# Patient Record
Sex: Male | Born: 1995 | Race: White | Hispanic: No | Marital: Single | Smoking: Never smoker
Health system: Southern US, Community
[De-identification: ages and names within clinical notes are randomized; demographics above are authoritative.]

## PROBLEM LIST (undated history)

## (undated) DIAGNOSIS — K589 Irritable bowel syndrome without diarrhea: Secondary | ICD-10-CM

## (undated) DIAGNOSIS — F840 Autistic disorder: Secondary | ICD-10-CM

## (undated) HISTORY — PX: EYE SURGERY: SHX253

---
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
## 2015-12-12 HISTORY — PX: BILIARY DRAINAGE: SHX1229

## 2016-09-17 ENCOUNTER — Emergency Department: Payer: Medicaid Other

## 2016-09-17 ENCOUNTER — Encounter: Payer: Self-pay | Admitting: Emergency Medicine

## 2016-09-17 ENCOUNTER — Inpatient Hospital Stay
Admission: EM | Admit: 2016-09-17 | Discharge: 2016-09-26 | DRG: 871 | Disposition: A | Discharge status: Home / Self Care | Payer: Medicaid Other | Attending: Surgery | Admitting: Surgery

## 2016-09-17 DIAGNOSIS — R509 Fever, unspecified: Secondary | ICD-10-CM | POA: Diagnosis not present

## 2016-09-17 DIAGNOSIS — J9601 Acute respiratory failure with hypoxia: Secondary | ICD-10-CM | POA: Diagnosis not present

## 2016-09-17 DIAGNOSIS — A4159 Other Gram-negative sepsis: Secondary | ICD-10-CM | POA: Diagnosis present

## 2016-09-17 DIAGNOSIS — K819 Cholecystitis, unspecified: Secondary | ICD-10-CM

## 2016-09-17 DIAGNOSIS — J939 Pneumothorax, unspecified: Secondary | ICD-10-CM | POA: Diagnosis present

## 2016-09-17 DIAGNOSIS — R7881 Bacteremia: Secondary | ICD-10-CM | POA: Diagnosis not present

## 2016-09-17 DIAGNOSIS — R0602 Shortness of breath: Secondary | ICD-10-CM

## 2016-09-17 DIAGNOSIS — R079 Chest pain, unspecified: Secondary | ICD-10-CM | POA: Diagnosis not present

## 2016-09-17 DIAGNOSIS — J181 Lobar pneumonia, unspecified organism: Secondary | ICD-10-CM | POA: Diagnosis not present

## 2016-09-17 DIAGNOSIS — B961 Klebsiella pneumoniae [K. pneumoniae] as the cause of diseases classified elsewhere: Secondary | ICD-10-CM | POA: Diagnosis present

## 2016-09-17 DIAGNOSIS — F840 Autistic disorder: Secondary | ICD-10-CM | POA: Diagnosis present

## 2016-09-17 DIAGNOSIS — R5081 Fever presenting with conditions classified elsewhere: Secondary | ICD-10-CM | POA: Diagnosis not present

## 2016-09-17 DIAGNOSIS — K810 Acute cholecystitis: Secondary | ICD-10-CM | POA: Diagnosis present

## 2016-09-17 DIAGNOSIS — K8000 Calculus of gallbladder with acute cholecystitis without obstruction: Secondary | ICD-10-CM | POA: Diagnosis present

## 2016-09-17 DIAGNOSIS — R0600 Dyspnea, unspecified: Secondary | ICD-10-CM

## 2016-09-17 DIAGNOSIS — J156 Pneumonia due to other aerobic Gram-negative bacteria: Secondary | ICD-10-CM | POA: Diagnosis not present

## 2016-09-17 DIAGNOSIS — A419 Sepsis, unspecified organism: Secondary | ICD-10-CM | POA: Diagnosis not present

## 2016-09-17 DIAGNOSIS — R6520 Severe sepsis without septic shock: Secondary | ICD-10-CM | POA: Diagnosis present

## 2016-09-17 DIAGNOSIS — K589 Irritable bowel syndrome without diarrhea: Secondary | ICD-10-CM | POA: Diagnosis present

## 2016-09-17 DIAGNOSIS — J150 Pneumonia due to Klebsiella pneumoniae: Secondary | ICD-10-CM | POA: Diagnosis present

## 2016-09-17 DIAGNOSIS — E876 Hypokalemia: Secondary | ICD-10-CM | POA: Diagnosis not present

## 2016-09-17 DIAGNOSIS — J9600 Acute respiratory failure, unspecified whether with hypoxia or hypercapnia: Secondary | ICD-10-CM

## 2016-09-17 DIAGNOSIS — R109 Unspecified abdominal pain: Secondary | ICD-10-CM

## 2016-09-17 DIAGNOSIS — J189 Pneumonia, unspecified organism: Secondary | ICD-10-CM

## 2016-09-17 HISTORY — DX: Autistic disorder: F84.0

## 2016-09-17 HISTORY — DX: Irritable bowel syndrome, unspecified: K58.9

## 2016-09-17 LAB — URINALYSIS COMPLETE WITH MICROSCOPIC (ARMC ONLY)
BILIRUBIN URINE: NEGATIVE
Bacteria, UA: NONE SEEN
Glucose, UA: NEGATIVE mg/dL
Hgb urine dipstick: NEGATIVE
KETONES UR: NEGATIVE mg/dL
Leukocytes, UA: NEGATIVE
NITRITE: NEGATIVE
PH: 6 (ref 5.0–8.0)
Protein, ur: NEGATIVE mg/dL
RBC / HPF: NONE SEEN RBC/hpf (ref 0–5)
SPECIFIC GRAVITY, URINE: 1.011 (ref 1.005–1.030)

## 2016-09-17 LAB — PROTIME-INR
INR: 1.07
PROTHROMBIN TIME: 13.9 s (ref 11.4–15.2)

## 2016-09-17 LAB — CBC
HCT: 47.8 % (ref 40.0–52.0)
Hemoglobin: 16.9 g/dL (ref 13.0–18.0)
MCH: 30.3 pg (ref 26.0–34.0)
MCHC: 35.3 g/dL (ref 32.0–36.0)
MCV: 86 fL (ref 80.0–100.0)
PLATELETS: 259 10*3/uL (ref 150–440)
RBC: 5.56 MIL/uL (ref 4.40–5.90)
RDW: 12.8 % (ref 11.5–14.5)
WBC: 21.9 10*3/uL — ABNORMAL HIGH (ref 3.8–10.6)

## 2016-09-17 LAB — GLUCOSE, CAPILLARY: Glucose-Capillary: 147 mg/dL — ABNORMAL HIGH (ref 65–99)

## 2016-09-17 LAB — COMPREHENSIVE METABOLIC PANEL
ALK PHOS: 93 U/L (ref 38–126)
ALT: 34 U/L (ref 17–63)
ANION GAP: 9 (ref 5–15)
AST: 24 U/L (ref 15–41)
Albumin: 4.4 g/dL (ref 3.5–5.0)
BILIRUBIN TOTAL: 1.2 mg/dL (ref 0.3–1.2)
BUN: 13 mg/dL (ref 6–20)
CALCIUM: 9.3 mg/dL (ref 8.9–10.3)
CO2: 25 mmol/L (ref 22–32)
CREATININE: 1.02 mg/dL (ref 0.61–1.24)
Chloride: 98 mmol/L — ABNORMAL LOW (ref 101–111)
Glucose, Bld: 123 mg/dL — ABNORMAL HIGH (ref 65–99)
Potassium: 3.7 mmol/L (ref 3.5–5.1)
SODIUM: 132 mmol/L — AB (ref 135–145)
TOTAL PROTEIN: 7.6 g/dL (ref 6.5–8.1)

## 2016-09-17 LAB — LIPASE, BLOOD: Lipase: 16 U/L (ref 11–51)

## 2016-09-17 LAB — MONONUCLEOSIS SCREEN: Mono Screen: NEGATIVE

## 2016-09-17 LAB — LACTIC ACID, PLASMA: Lactic Acid, Venous: 1.4 mmol/L (ref 0.5–1.9)

## 2016-09-17 LAB — TROPONIN I: Troponin I: <0.03 ng/mL (ref <0.03)

## 2016-09-17 LAB — MRSA PCR SCREENING: MRSA BY PCR: NEGATIVE

## 2016-09-17 MED ORDER — MORPHINE SULFATE (PF) 4 MG/ML IV SOLN
4.0000 mg | Freq: Once | INTRAVENOUS | Status: DC
Start: 2016-09-17 — End: 2016-09-17

## 2016-09-17 MED ORDER — LACTATED RINGERS IV SOLN
INTRAVENOUS | Status: DC
  Administered 2016-09-17 – 2016-09-18 (×4): via INTRAVENOUS

## 2016-09-17 MED ORDER — ACETAMINOPHEN 500 MG PO TABS
1000.0000 mg | ORAL_TABLET | Freq: Four times a day (QID) | ORAL | Status: DC
  Administered 2016-09-17 – 2016-09-21 (×12): 1000 mg via ORAL
  Filled 2016-09-17 (×13): qty 2

## 2016-09-17 MED ORDER — ENOXAPARIN SODIUM 40 MG/0.4ML ~~LOC~~ SOLN
40.0000 mg | SUBCUTANEOUS | Status: DC
  Administered 2016-09-17: 40 mg via SUBCUTANEOUS
  Filled 2016-09-17: qty 0.4

## 2016-09-17 MED ORDER — ONDANSETRON 4 MG PO TBDP: 4.0000 mg | ORAL_TABLET | Freq: Four times a day (QID) | ORAL | Status: DC | PRN

## 2016-09-17 MED ORDER — SODIUM CHLORIDE 0.9 % IV BOLUS (SEPSIS)
1000.0000 mL | Freq: Once | INTRAVENOUS | Status: AC
  Administered 2016-09-17: 1000 mL via INTRAVENOUS

## 2016-09-17 MED ORDER — HYDROMORPHONE HCL 1 MG/ML IJ SOLN
0.5000 mg | INTRAMUSCULAR | Status: DC | PRN
  Administered 2016-09-18 – 2016-09-21 (×7): 0.5 mg via INTRAVENOUS
  Filled 2016-09-17 (×7): qty 1

## 2016-09-17 MED ORDER — PIPERACILLIN-TAZOBACTAM 3.375 G IVPB
3.3750 g | Freq: Four times a day (QID) | INTRAVENOUS | Status: DC
  Administered 2016-09-17 – 2016-09-18 (×4): 3.375 g via INTRAVENOUS
  Filled 2016-09-17 (×4): qty 50

## 2016-09-17 MED ORDER — IOPAMIDOL (ISOVUE-300) INJECTION 61%
100.0000 mL | Freq: Once | INTRAVENOUS | Status: AC | PRN
  Administered 2016-09-17: 100 mL via INTRAVENOUS

## 2016-09-17 MED ORDER — PIPERACILLIN-TAZOBACTAM 3.375 G IVPB
3.3750 g | Freq: Once | INTRAVENOUS | Status: AC
  Administered 2016-09-17: 3.375 g via INTRAVENOUS
  Filled 2016-09-17: qty 50

## 2016-09-17 MED ORDER — KETOROLAC TROMETHAMINE 30 MG/ML IJ SOLN
30.0000 mg | Freq: Four times a day (QID) | INTRAMUSCULAR | Status: DC
  Administered 2016-09-17 – 2016-09-18 (×4): 30 mg via INTRAVENOUS
  Filled 2016-09-17 (×4): qty 1

## 2016-09-17 MED ORDER — MORPHINE SULFATE (PF) 4 MG/ML IV SOLN
4.0000 mg | Freq: Once | INTRAVENOUS | Status: AC
Start: 2016-09-17 — End: 2016-09-17
  Administered 2016-09-17: 4 mg via INTRAVENOUS
  Filled 2016-09-17: qty 1

## 2016-09-17 MED ORDER — MORPHINE SULFATE (PF) 4 MG/ML IV SOLN
INTRAVENOUS | Status: AC
  Filled 2016-09-17: qty 1

## 2016-09-17 MED ORDER — IOPAMIDOL (ISOVUE-300) INJECTION 61%
30.0000 mL | Freq: Once | INTRAVENOUS | Status: AC | PRN
  Administered 2016-09-17: 30 mL via ORAL

## 2016-09-17 MED ORDER — MORPHINE SULFATE (PF) 4 MG/ML IV SOLN
4.0000 mg | Freq: Once | INTRAVENOUS | Status: AC
  Administered 2016-09-17: 4 mg via INTRAVENOUS

## 2016-09-17 MED ORDER — FAMOTIDINE IN NACL 20-0.9 MG/50ML-% IV SOLN
20.0000 mg | Freq: Two times a day (BID) | INTRAVENOUS | Status: DC
  Administered 2016-09-17 – 2016-09-18 (×3): 20 mg via INTRAVENOUS
  Filled 2016-09-17 (×4): qty 50

## 2016-09-17 MED ORDER — OXYCODONE HCL 5 MG PO TABS
5.0000 mg | ORAL_TABLET | ORAL | Status: DC | PRN
  Administered 2016-09-17: 5 mg via ORAL
  Administered 2016-09-17 – 2016-09-19 (×2): 10 mg via ORAL
  Administered 2016-09-19: 5 mg via ORAL
  Filled 2016-09-17: qty 2
  Filled 2016-09-17: qty 1
  Filled 2016-09-17: qty 2
  Filled 2016-09-17: qty 1

## 2016-09-17 MED ORDER — ONDANSETRON HCL 4 MG/2ML IJ SOLN
4.0000 mg | Freq: Once | INTRAMUSCULAR | Status: AC
  Administered 2016-09-17: 4 mg via INTRAVENOUS
  Filled 2016-09-17: qty 2

## 2016-09-17 MED ORDER — ONDANSETRON HCL 4 MG/2ML IJ SOLN: 4.0000 mg | Freq: Four times a day (QID) | INTRAMUSCULAR | Status: DC | PRN

## 2016-09-17 NOTE — ED Notes (Signed)
Patient transported to Ultrasound 

## 2016-09-17 NOTE — ED Notes (Signed)
Pt's room air sat noted to be in the upper 80's, pt falling asleep and resting after 2nd dose of pain medication, pt placed on 2L of O2 via [place], no distress noted, cont to monitor

## 2016-09-17 NOTE — ED Triage Notes (Signed)
Pt presents to ED with reports of RUQ abdominal pain. Pt denies NVD. Pt reports he had a small bowel movement yesterday but thinks he might be constipated. Pt reports decreased appetite.

## 2016-09-17 NOTE — ED Provider Notes (Signed)
[facility] Emergency Department Provider Note ____________________________________________   First MD Initiated Contact with Patient 09/17/16 [phone]     (approximate)  I have reviewed the triage vital signs and the nursing notes.   HISTORY  Chief Complaint Abdominal Pain    HPI Joshua [name] is a 20 y.o. male reports moderate and increasing right upper abdominal pain along with not wanting to eat anything, feeling dehydrated for the lastday, also nauseated but not vomiting. No diarrhea. Denies chest pain or shortness of breath. No recent trips or travel. He doesn't smoke or drink.  History of blood clots. No history of any heart problems.  No trouble urinating. No pain in the pelvis or testicles or groin. He does report yesterday his mid back was hurting along with the right-sided abdominal pain.   Past Medical History:  Diagnosis Date  . Autism   . Irritable bowel syndrome     There are no active problems to display for this patient.   Past Surgical History:  Procedure Laterality Date  . EYE SURGERY      Prior to Admission medications   Not on File    Allergies Review of patient's allergies indicates no known allergies.  No family history on file.  Social History Social History  Substance Use Topics  . Smoking status: Not on file  . Smokeless tobacco: Not on file  . Alcohol use Not on file    Review of Systems Constitutional:Fatigue, feels dehydrated Eyes: No visual changes. ENT: No sore throat. Cardiovascular: Denies chest pain. Respiratory: Denies shortness of breath. Gastrointestinal: No vomiting.  No diarrhea.  No constipation. Genitourinary: Negative for dysuria. Musculoskeletal: See history of present illness. Skin: Negative for rash. Neurological: Negative for headaches, focal weakness or numbness.  10-point ROS otherwise negative.  ____________________________________________   PHYSICAL EXAM:  VITAL SIGNS: ED  Triage Vitals  Enc Vitals Group     BP 09/17/16 0916 (!) 149/79     Pulse Rate 09/17/16 0916 (!) 137     Resp 09/17/16 0916 20     Temp 09/17/16 0916 98.3 F (36.8 C)     Temp Source 09/17/16 0916 Oral     SpO2 09/17/16 0916 97 %     Weight 09/17/16 0914 209 lb 7 oz (95 kg)     Height 09/17/16 0914 5\' 4"  (1.626 m)     Head Circumference --      Peak Flow --      Pain Score 09/17/16 0914 7     Pain Loc --      Pain Edu? --      Excl. in GC? --     Constitutional: Alert and oriented.Fatigued, otherwise appearing and in no acute distress. Does appear ill, but not in extremis. Both patient and his mother extremely pleasant. Eyes: Conjunctivae are normal. PERRL. EOMI. Head: Atraumatic. Nose: No congestion/rhinnorhea. Mouth/Throat: Mucous membranes are quite dry.  Oropharynx non-erythematous. Neck: No stridor.   Cardiovascular: Tachycardic rate, regular rhythm. Grossly normal heart sounds.  Good peripheral circulation. Respiratory: Normal respiratory effort.  No retractions. Lungs CTAB. Gastrointestinal: Soft and nontender over the left abdomen, moderate tenderness in the mid to right upper quadrant, with a equivocal but seemingly negative Murphy. Clear lungs. No CVA tenderness tenderness. No abdominal bruits. No CVA tenderness. Musculoskeletal: No lower extremity tenderness nor edema.   Neurologic:  Normal speech and language. No gross focal neurologic deficits are appreciated. No gait instability. Skin:  Skin is warm, dry and intact. No  rash noted. Psychiatric: Mood and affect are normal. Speech and behavior are normal.  ____________________________________________   LABS (all labs ordered are listed, but only abnormal results are displayed)  Labs Reviewed  COMPREHENSIVE METABOLIC PANEL - Abnormal; Notable for the following:       Result Value   Sodium 132 (*)    Chloride 98 (*)    Glucose, Bld 123 (*)    All other components within normal limits  CBC - Abnormal; Notable for  the following:    WBC 21.9 (*)    All other components within normal limits  URINALYSIS COMPLETEWITH MICROSCOPIC ([facility] ONLY) - Abnormal; Notable for the following:    Color, Urine YELLOW (*)    APPearance CLEAR (*)    Squamous Epithelial / LPF 0-5 (*)    All other components within normal limits  GLUCOSE, CAPILLARY - Abnormal; Notable for the following:    Glucose-Capillary 147 (*)    All other components within normal limits  CULTURE, BLOOD (ROUTINE X 2)  CULTURE, BLOOD (ROUTINE X 2)  LIPASE, BLOOD  LACTIC ACID, PLASMA  TROPONIN I  PROTIME-INR  MONONUCLEOSIS SCREEN  CBG MONITORING, ED   ____________________________________________  EKG  Reviewed injury by me at 9:20 AM Heart rate 125 PR 120 QTc 410 Sinus tachycardia, RSR prime pattern is noted though no evidence of block.  No evidence of acute ST elevation MI, nonspecific T wave abnormality seen is likely secondary to rate related. ____________________________________________  RADIOLOGY  Dg Chest 2 View  Result Date: 09/17/2016 CLINICAL DATA:  Pt has a fever and is having RUQ pain for a couple of days now. Nonsmoker. No heart or lung problems. EXAM: CHEST  2 VIEW COMPARISON:  None. FINDINGS: The heart size and mediastinal contours are within normal limits. Both lungs are clear. No pleural effusion or pneumothorax. The visualized skeletal structures are unremarkable. IMPRESSION: No active cardiopulmonary disease. Electronically Signed   By: Amie Portland M.D.   On: 09/17/2016 11:53   Ct Abdomen Pelvis W Contrast  Result Date: 09/17/2016 CLINICAL DATA:  Pt presents to ED with reports of RUQ abdominal pain. Pt denies NVD. Pt reports he had a small bowel movement yesterday but thinks he might be constipated. Pt reports decreased appetite EXAM: CT ABDOMEN AND PELVIS WITH CONTRAST TECHNIQUE: Multidetector CT imaging of the abdomen and pelvis was performed using the standard protocol following bolus administration of intravenous  contrast. CONTRAST:  ISOVUE-300 IOPAMIDOL (ISOVUE-300) INJECTION 61% COMPARISON:  Right upper quadrant ultrasound, 09/17/2016 FINDINGS: Lower chest: No acute abnormality. Hepatobiliary: Despite the unremarkable appearance of the gallbladder on the current ultrasound, on CT, the gallbladder is distended. Wall is prominent but not overtly thickened. There is pericholecystic inflammation. A stone projects in the gallbladder neck. The liver is unremarkable. There is no bile duct dilation. Pancreas: Unremarkable. No pancreatic ductal dilatation or surrounding inflammatory changes. Spleen: Normal in size without focal abnormality. Adrenals/Urinary Tract: Adrenal glands are unremarkable. Kidneys are normal, without renal calculi, focal lesion, or hydronephrosis. Bladder is unremarkable. Stomach/Bowel: Stomach is within normal limits. Appendix appears normal. No evidence of bowel wall thickening, distention, or inflammatory changes. Vascular/Lymphatic: No significant vascular findings are present. No enlarged abdominal or pelvic lymph nodes. Reproductive: Prostate is unremarkable. Other: No abdominal wall hernia or abnormality. No abdominopelvic ascites. Musculoskeletal: Unremarkable. IMPRESSION: 1. Gallbladder is distended with pericholecystic inflammation as well as a gallstone that projects in the gallbladder neck. Despite the normal appearance of the gallbladder on the current ultrasound, the findings strongly suggest  acute cholecystitis. No bile duct stone or biliary dilation. 2. No other acute abnormality within the abdomen pelvis. Exam is otherwise unremarkable. Electronically Signed   By: Amie Portlandavid  Ormond M.D.   On: 09/17/2016 11:59   [place] Abdomen Limited Ruq  Result Date: 09/17/2016 CLINICAL DATA:  One day history of right upper quadrant pain EXAM: US ABDOMEN LIMITED - RIGHT UPPER QUADRANT COMPARISON:  None. FINDINGS: Gallbladder: No gallstones or wall thickening visualized. There is no pericholecystic fluid.  No sonographic Murphy sign noted by sonographer. Common bile duct: Diameter: 4 mm. No intrahepatic or extrahepatic biliary duct dilatation. Liver: No focal lesion identified. Within normal limits in parenchymal echogenicity. IMPRESSION: Study within normal limits. Electronically Signed   By: Bretta BangWilliam  Woodruff III M.D.   On: 09/17/2016 10:36    ____________________________________________   PROCEDURES  Procedure(s) performed: None  Procedures  Critical Care performed: Yes, see critical care note(s)  CRITICAL CARE Performed by: Sharyn CreamerQUALE, Thora Scherman   Total critical care time: 40 minutes  Critical care time was exclusive of separately billable procedures and treating other patients.  Critical care was necessary to treat or prevent imminent or life-threatening deterioration.  Critical care was time spent personally by me on the following activities: development of treatment plan with patient and/or surrogate as well as nursing, discussions with consultants, evaluation of patient's response to treatment, examination of patient, obtaining history from patient or surrogate, ordering and performing treatments and interventions, ordering and review of laboratory studies, ordering and review of radiographic studies, pulse oximetry and re-evaluation of patient's condition.  Patient required evaluation for possible sepsis, required multiple fluid boluses due to tachycardia. Heart rate greater than 120, elevated white blood cell count, initiation of antibiotics. Code sepsis. ____________________________________________   INITIAL IMPRESSION / ASSESSMENT AND PLAN / ED COURSE  Pertinent labs & imaging results that were available during my care of the patient were reviewed by me and considered in my medical decision making (see chart for details).  Patient presents for evaluation for chills, right upper quadrant abdominal pain, dehydration and poor appetite. Notably quite tachycardic on presentation, he  does have a temperature 99.9 on my check, significant leukocytosis and focal pain in the right mid to right upper abdomen. We will initiate and started with right upper quadrant ultrasound which is not showing acute pathology, as we'll order a chest x-ray to evaluate for possible pulmonary etiology though I most suspect based on symptomatology abdominal etiology and will thus proceed with CT scan.  Clinical Course  Comment By Time  Patient reports no discomfort at this time. Patient did not wish for morphine. Discussed with patient lab results, and plan for CT and x-ray. Patient and mother both agreeable. Appears improved heart rate improved. Resting comfortably. Sharyn CreamerMark Zain Bingman, MD 10/08 1119   ----------------------------------------- 12:14 PM on 09/17/2016 -----------------------------------------  CT scan consistent with acute cholecystitis, interestingly is despite a normal ultrasound. Symptomatology is consistent with however, and I discussed the case with Wyonia HoughKatherine Laughlin of Gen. surgery who is aware of my concern for sepsis with possible cholecystitis. Patient reporting improvement, resting comfortably. Antibiotics given, code sepsis.  ____________________________________________   FINAL CLINICAL IMPRESSION(S) / ED DIAGNOSES  Final diagnoses:  Abdominal pain  Sepsis, due to unspecified organism [facility](HCC)  Acute cholecystitis      NEW MEDICATIONS STARTED DURING THIS VISIT:  New Prescriptions   No medications on file     Note:  This document was prepared using Dragon voice recognition software and may include unintentional dictation errors.  Sharyn Creamer, MD 09/17/16 1215

## 2016-09-17 NOTE — H&P (Signed)
Joshua [name] is an 20 y.o. male.   Chief Complaint: abdominal pain HPI: 20 year old male with complaint of abdominal pain starting yesterday. Patient is unsure what he ate right beforehand. Patient states that the pain started in mid day and was in his epigastric area and felt like a board through straight to his back. Patient states that the pain became increasingly worse to 10 out of 10 pain. Patient states that he did have some fever and chills as well. Patient had some nausea but no vomiting. Patient also had some pain in his right scapular region that felt like a soreness. Patient did not have any symptoms of flatulence diarrhea dysuria hematuria or jaundice.  Past Medical History:  Diagnosis Date  . Autism   . Irritable bowel syndrome     Past Surgical History:  Procedure Laterality Date  . EYE SURGERY      History reviewed. No pertinent family history. negative for heart disease, cancers or gallbladder disease Social History:  reports that he has never smoked. He has never used smokeless tobacco. His alcohol and drug histories are not on file.  Allergies: No Known Allergies  No prescriptions prior to admission.    Results for orders placed or performed during the hospital encounter of 09/17/16 (from the past 48 hour(s))  Lipase, blood     Status: None   Collection Time: 09/17/16  9:18 AM  Result Value Ref Range   Lipase 16 11 - 51 U/L  Comprehensive metabolic panel     Status: Abnormal   Collection Time: 09/17/16  9:18 AM  Result Value Ref Range   Sodium 132 (L) 135 - 145 mmol/L   Potassium 3.7 3.5 - 5.1 mmol/L   Chloride 98 (L) 101 - 111 mmol/L   CO2 25 22 - 32 mmol/L   Glucose, Bld 123 (H) 65 - 99 mg/dL   BUN 13 6 - 20 mg/dL   Creatinine, Ser 1.02 0.61 - 1.24 mg/dL   Calcium 9.3 8.9 - 10.3 mg/dL   Total Protein 7.6 6.5 - 8.1 g/dL   Albumin 4.4 3.5 - 5.0 g/dL   AST 24 15 - 41 U/L   ALT 34 17 - 63 U/L   Alkaline Phosphatase 93 38 - 126 U/L   Total Bilirubin 1.2 0.3  - 1.2 mg/dL   GFR calc non Af Amer >60 >60 mL/min   GFR calc Af Amer >60 >60 mL/min    Comment: (NOTE) The eGFR has been calculated using the CKD EPI equation. This calculation has not been validated in all clinical situations. eGFR's persistently <60 mL/min signify possible Chronic Kidney Disease.    Anion gap 9 5 - 15  CBC     Status: Abnormal   Collection Time: 09/17/16  9:18 AM  Result Value Ref Range   WBC 21.9 (H) 3.8 - 10.6 K/uL   RBC 5.56 4.40 - 5.90 MIL/uL   Hemoglobin 16.9 13.0 - 18.0 g/dL   HCT 47.8 40.0 - 52.0 %   MCV 86.0 80.0 - 100.0 fL   MCH 30.3 26.0 - 34.0 pg   MCHC 35.3 32.0 - 36.0 g/dL   RDW 12.8 11.5 - 14.5 %   Platelets 259 150 - 440 K/uL  Troponin I     Status: None   Collection Time: 09/17/16  9:18 AM  Result Value Ref Range   Troponin I <0.03 <0.03 ng/mL  Protime-INR     Status: None   Collection Time: 09/17/16  9:18 AM  Result  Value Ref Range   Prothrombin Time 13.9 11.4 - 15.2 seconds   INR 1.07   Mononucleosis screen     Status: None   Collection Time: 09/17/16  9:18 AM  Result Value Ref Range   Mono Screen NEGATIVE NEGATIVE  Lactic acid, plasma     Status: None   Collection Time: 09/17/16  9:43 AM  Result Value Ref Range   Lactic Acid, Venous 1.4 0.5 - 1.9 mmol/L  Glucose, capillary     Status: Abnormal   Collection Time: 09/17/16  9:45 AM  Result Value Ref Range   Glucose-Capillary 147 (H) 65 - 99 mg/dL  Urinalysis complete, with microscopic     Status: Abnormal   Collection Time: 09/17/16 10:40 AM  Result Value Ref Range   Color, Urine YELLOW (A) YELLOW   APPearance CLEAR (A) CLEAR   Glucose, UA NEGATIVE NEGATIVE mg/dL   Bilirubin Urine NEGATIVE NEGATIVE   Ketones, ur NEGATIVE NEGATIVE mg/dL   Specific Gravity, Urine 1.011 1.005 - 1.030   Hgb urine dipstick NEGATIVE NEGATIVE   pH 6.0 5.0 - 8.0   Protein, ur NEGATIVE NEGATIVE mg/dL   Nitrite NEGATIVE NEGATIVE   Leukocytes, UA NEGATIVE NEGATIVE   RBC / HPF NONE SEEN 0 - 5 RBC/hpf    WBC, UA 0-5 0 - 5 WBC/hpf   Bacteria, UA NONE SEEN NONE SEEN   Squamous Epithelial / LPF 0-5 (A) NONE SEEN   Mucous PRESENT    Dg Chest 2 View  Result Date: 09/17/2016 CLINICAL DATA:  Pt has a fever and is having RUQ pain for a couple of days now. Nonsmoker. No heart or lung problems. EXAM: CHEST  2 VIEW COMPARISON:  None. FINDINGS: The heart size and mediastinal contours are within normal limits. Both lungs are clear. No pleural effusion or pneumothorax. The visualized skeletal structures are unremarkable. IMPRESSION: No active cardiopulmonary disease. Electronically Signed   By: Lajean Manes M.D.   On: 09/17/2016 11:53   Ct Abdomen Pelvis W Contrast  Result Date: 09/17/2016 CLINICAL DATA:  Pt presents to ED with reports of RUQ abdominal pain. Pt denies NVD. Pt reports he had a small bowel movement yesterday but thinks he might be constipated. Pt reports decreased appetite EXAM: CT ABDOMEN AND PELVIS WITH CONTRAST TECHNIQUE: Multidetector CT imaging of the abdomen and pelvis was performed using the standard protocol following bolus administration of intravenous contrast. CONTRAST:  167m ISOVUE-300 IOPAMIDOL (ISOVUE-300) INJECTION 61% COMPARISON:  Right upper quadrant ultrasound, 09/17/2016 FINDINGS: Lower chest: No acute abnormality. Hepatobiliary: Despite the unremarkable appearance of the gallbladder on the current ultrasound, on CT, the gallbladder is distended. Wall is prominent but not overtly thickened. There is pericholecystic inflammation. A stone projects in the gallbladder neck. The liver is unremarkable. There is no bile duct dilation. Pancreas: Unremarkable. No pancreatic ductal dilatation or surrounding inflammatory changes. Spleen: Normal in size without focal abnormality. Adrenals/Urinary Tract: Adrenal glands are unremarkable. Kidneys are normal, without renal calculi, focal lesion, or hydronephrosis. Bladder is unremarkable. Stomach/Bowel: Stomach is within normal limits. Appendix  appears normal. No evidence of bowel wall thickening, distention, or inflammatory changes. Vascular/Lymphatic: No significant vascular findings are present. No enlarged abdominal or pelvic lymph nodes. Reproductive: Prostate is unremarkable. Other: No abdominal wall hernia or abnormality. No abdominopelvic ascites. Musculoskeletal: Unremarkable. IMPRESSION: 1. Gallbladder is distended with pericholecystic inflammation as well as a gallstone that projects in the gallbladder neck. Despite the normal appearance of the gallbladder on the current ultrasound, the findings strongly suggest acute cholecystitis.  No bile duct stone or biliary dilation. 2. No other acute abnormality within the abdomen pelvis. Exam is otherwise unremarkable. Electronically Signed   By: Lajean Manes M.D.   On: 09/17/2016 11:59   [place] Abdomen Limited Ruq  Result Date: 09/17/2016 CLINICAL DATA:  One day history of right upper quadrant pain EXAM: US ABDOMEN LIMITED - RIGHT UPPER QUADRANT COMPARISON:  None. FINDINGS: Gallbladder: No gallstones or wall thickening visualized. There is no pericholecystic fluid. No sonographic Murphy sign noted by sonographer. Common bile duct: Diameter: 4 mm. No intrahepatic or extrahepatic biliary duct dilatation. Liver: No focal lesion identified. Within normal limits in parenchymal echogenicity. IMPRESSION: Study within normal limits. Electronically Signed   By: Lowella Grip III M.D.   On: 09/17/2016 10:36    Review of Systems  Constitutional: Positive for chills and fever. Negative for diaphoresis and malaise/fatigue.  HENT: Negative for congestion.   Respiratory: Negative for cough and shortness of breath.   Cardiovascular: Negative for chest pain and leg swelling.  Gastrointestinal: Positive for abdominal pain, constipation, heartburn and nausea. Negative for blood in stool, diarrhea and vomiting.  Genitourinary: Negative for dysuria, flank pain and hematuria.  Musculoskeletal: Negative for  joint pain and myalgias.  Skin: Negative for itching and rash.  Neurological: Negative for dizziness, loss of consciousness, weakness and headaches.  Psychiatric/Behavioral: The patient is not nervous/anxious.   All other systems reviewed and are negative.   Blood pressure 139/75, pulse (!) 108, temperature 98.4 F (36.9 C), temperature source Oral, resp. rate 20, height '5\' 4"'  (1.626 m), weight 207 lb (93.9 kg), SpO2 96 %. Physical Exam  Vitals reviewed. Constitutional: He is oriented to person, place, and time. He appears well-developed and well-nourished. No distress.  HENT:  Head: Normocephalic and atraumatic.  Right Ear: External ear normal.  Left Ear: External ear normal.  Nose: Nose normal.  Mouth/Throat: Oropharynx is clear and moist. No oropharyngeal exudate.  Eyes: Conjunctivae and EOM are normal. Pupils are equal, round, and reactive to light. No scleral icterus.  Neck: Normal range of motion. Neck supple. No tracheal deviation present.  Cardiovascular: Regular rhythm, normal heart sounds and intact distal pulses.  Exam reveals no gallop and no friction rub.   No murmur heard. tachycardia  Respiratory: Effort normal. No respiratory distress. He has no wheezes. He has no rales.  GI: Soft. Bowel sounds are normal. He exhibits no distension. There is tenderness. There is no rebound and no guarding.  Epigastric and RUQ pain, + Murphy's sign  Musculoskeletal: Normal range of motion. He exhibits no edema, tenderness or deformity.  Neurological: He is alert and oriented to person, place, and time. No cranial nerve deficit.  Skin: Skin is warm and dry. No rash noted. No erythema. No pallor.  Psychiatric: He has a normal mood and affect. His behavior is normal. Judgment and thought content normal.     Assessment/Plan I have personally reviewed his past medical history which is negative except for irritable bowel syndrome which is not been formally diagnosed and autism.  I personally  reviewed his laboratory values which do have a significantly high white blood cell count of 21, his LFTs are within normal limits however his bilirubin is a 1.2 which is at the upper limit of normal. I have personally reviewed his ultrasound images which show a distended gallbladder but don't show any stones and no wall thickening on that test. I have also personally reviewed his CT scan images which do so some fluid and inflammation around  the gallbladder. Review the radiology reads as above  I do believe that she has acute cholecystitis given his results from the CT scanning positive Murphy sign. He appears to be dehydrated at this time and will continue to give him fluids and IV antibiotics. I did discuss with him and his mother that he will need his gallbladder out and likely will be able to get him on the schedule for tomorrow. I discussed with them that it would likely be my partner Dr. Hampton Abbot at that time and they expressed understanding and agreement with this plan.  The risks, benefits, complications, treatment options, and expected outcomes were discussed with the patient. The possibilities of bleeding, recurrent infection, finding a normal gallbladder, perforation of viscus organs, damage to surrounding structures, bile leak, abscess formation, needing a drain placed, the need for additional procedures, reaction to medication, pulmonary aspiration,  failure to diagnose a condition, the possible need to convert to an open procedure, and creating a complication requiring transfusion or operation were discussed with the patient. The patient and/or family concurred with the proposed plan, giving informed consent.      Hubbard Robinson, MD 09/17/2016, 6:02 PM

## 2016-09-18 ENCOUNTER — Encounter: Payer: Self-pay | Admitting: *Deleted

## 2016-09-18 ENCOUNTER — Encounter
Admission: EM | Disposition: A | Discharge status: Home / Self Care | Payer: Self-pay | Source: Home / Self Care | Attending: Surgery

## 2016-09-18 ENCOUNTER — Inpatient Hospital Stay: Payer: Medicaid Other

## 2016-09-18 ENCOUNTER — Inpatient Hospital Stay (HOSPITAL_COMMUNITY)
Admit: 2016-09-18 | Discharge: 2016-09-18 | Disposition: A | Discharge status: Home / Self Care | Payer: Medicaid Other | Attending: Internal Medicine | Admitting: Internal Medicine

## 2016-09-18 ENCOUNTER — Encounter: Payer: Self-pay | Admitting: Anesthesiology

## 2016-09-18 DIAGNOSIS — K8000 Calculus of gallbladder with acute cholecystitis without obstruction: Secondary | ICD-10-CM

## 2016-09-18 DIAGNOSIS — A419 Sepsis, unspecified organism: Secondary | ICD-10-CM

## 2016-09-18 DIAGNOSIS — J939 Pneumothorax, unspecified: Secondary | ICD-10-CM | POA: Diagnosis present

## 2016-09-18 DIAGNOSIS — R079 Chest pain, unspecified: Secondary | ICD-10-CM

## 2016-09-18 DIAGNOSIS — J9601 Acute respiratory failure with hypoxia: Secondary | ICD-10-CM

## 2016-09-18 LAB — ECHOCARDIOGRAM COMPLETE
AOPV: 0.84 m/s
AV peak Index: 1.12
AV pk vel: 148 cm/s
AVAREAVTI: 2.15 cm2
AVPG: 9 mmHg
CHL CUP MV DEC (S): 261
CHL CUP RV SYS PRESS: 31 mmHg
EERAT: 8.52
EWDT: 261 ms
FS: 30 % (ref 28–44)
Height: 60 in
IVS/LV PW RATIO, ED: 1
LA diam end sys: 34 mm
LA vol A4C: 29.2 ml
LADIAMINDEX: 1.77 cm/m2
LASIZE: 34 mm
LVEEAVG: 8.52
LVEEMED: 8.52
LVOT area: 2.54 cm2
LVOT diameter: 18 mm
LVOT peak grad rest: 6 mmHg
LVOTPV: 125 cm/s
MV Peak grad: 4 mmHg
MVPKEVEL: 95.4 m/s
PW: 10.5 mm — AB (ref 0.6–1.1)
RV LATERAL S' VELOCITY: 12.7 cm/s
RV TAPSE: 17.5 mm
Reg peak vel: 308 cm/s
TDI e' medial: 11.2
TR max vel: 308 cm/s
Weight: 3421.54 oz

## 2016-09-18 LAB — COMPREHENSIVE METABOLIC PANEL
ALK PHOS: 70 U/L (ref 38–126)
ALT: 23 U/L (ref 17–63)
AST: 16 U/L (ref 15–41)
Albumin: 3.3 g/dL — ABNORMAL LOW (ref 3.5–5.0)
Anion gap: 6 (ref 5–15)
BUN: 12 mg/dL (ref 6–20)
CALCIUM: 8.5 mg/dL — AB (ref 8.9–10.3)
CO2: 28 mmol/L (ref 22–32)
CREATININE: 1.1 mg/dL (ref 0.61–1.24)
Chloride: 101 mmol/L (ref 101–111)
Glucose, Bld: 94 mg/dL (ref 65–99)
Potassium: 3.9 mmol/L (ref 3.5–5.1)
Sodium: 135 mmol/L (ref 135–145)
Total Bilirubin: 1.5 mg/dL — ABNORMAL HIGH (ref 0.3–1.2)
Total Protein: 6 g/dL — ABNORMAL LOW (ref 6.5–8.1)

## 2016-09-18 LAB — CBC
HEMATOCRIT: 42.1 % (ref 40.0–52.0)
HEMOGLOBIN: 14.9 g/dL (ref 13.0–18.0)
MCH: 30.8 pg (ref 26.0–34.0)
MCHC: 35.4 g/dL (ref 32.0–36.0)
MCV: 87.1 fL (ref 80.0–100.0)
Platelets: 193 10*3/uL (ref 150–440)
RBC: 4.84 MIL/uL (ref 4.40–5.90)
RDW: 12.9 % (ref 11.5–14.5)
WBC: 21.5 10*3/uL — ABNORMAL HIGH (ref 3.8–10.6)

## 2016-09-18 LAB — BLOOD CULTURE ID PANEL (REFLEXED)
ACINETOBACTER BAUMANNII: NOT DETECTED
CANDIDA PARAPSILOSIS: NOT DETECTED
CARBAPENEM RESISTANCE: NOT DETECTED
Candida albicans: NOT DETECTED
Candida glabrata: NOT DETECTED
Candida krusei: NOT DETECTED
Candida tropicalis: NOT DETECTED
ENTEROBACTERIACEAE SPECIES: DETECTED — AB
ENTEROCOCCUS SPECIES: NOT DETECTED
Enterobacter cloacae complex: NOT DETECTED
Escherichia coli: NOT DETECTED
HAEMOPHILUS INFLUENZAE: NOT DETECTED
KLEBSIELLA PNEUMONIAE: DETECTED — AB
Klebsiella oxytoca: NOT DETECTED
Listeria monocytogenes: NOT DETECTED
Neisseria meningitidis: NOT DETECTED
PSEUDOMONAS AERUGINOSA: NOT DETECTED
Proteus species: NOT DETECTED
STAPHYLOCOCCUS AUREUS BCID: NOT DETECTED
STAPHYLOCOCCUS SPECIES: NOT DETECTED
STREPTOCOCCUS PNEUMONIAE: NOT DETECTED
STREPTOCOCCUS PYOGENES: NOT DETECTED
STREPTOCOCCUS SPECIES: NOT DETECTED
Serratia marcescens: NOT DETECTED
Streptococcus agalactiae: NOT DETECTED

## 2016-09-18 LAB — PROCALCITONIN: PROCALCITONIN: 1.72 ng/mL

## 2016-09-18 LAB — BRAIN NATRIURETIC PEPTIDE: B NATRIURETIC PEPTIDE 5: 75 pg/mL (ref 0.0–100.0)

## 2016-09-18 LAB — GLUCOSE, CAPILLARY: GLUCOSE-CAPILLARY: 83 mg/dL (ref 65–99)

## 2016-09-18 SURGERY — LAPAROSCOPIC CHOLECYSTECTOMY
Anesthesia: Choice

## 2016-09-18 MED ORDER — MORPHINE SULFATE (PF) 2 MG/ML IV SOLN: 2.0000 mg | INTRAVENOUS | Status: DC | PRN

## 2016-09-18 MED ORDER — PIPERACILLIN-TAZOBACTAM 3.375 G IVPB: 3.3750 g | Freq: Three times a day (TID) | INTRAVENOUS | Status: DC

## 2016-09-18 MED ORDER — FAMOTIDINE 20 MG PO TABS
20.0000 mg | ORAL_TABLET | Freq: Two times a day (BID) | ORAL | Status: DC
  Administered 2016-09-18 – 2016-09-26 (×16): 20 mg via ORAL
  Filled 2016-09-18 (×16): qty 1

## 2016-09-18 MED ORDER — DILTIAZEM HCL 100 MG IV SOLR: 5.0000 mg/h | INTRAVENOUS | Status: DC

## 2016-09-18 MED ORDER — AZITHROMYCIN 250 MG PO TABS
250.0000 mg | ORAL_TABLET | Freq: Every day | ORAL | Status: AC
  Administered 2016-09-19 – 2016-09-22 (×4): 250 mg via ORAL
  Filled 2016-09-18 (×4): qty 1

## 2016-09-18 MED ORDER — IOPAMIDOL (ISOVUE-370) INJECTION 76%
75.0000 mL | Freq: Once | INTRAVENOUS | Status: AC | PRN
  Administered 2016-09-18: 75 mL via INTRAVENOUS

## 2016-09-18 MED ORDER — BUPIVACAINE-EPINEPHRINE (PF) 0.25% -1:200000 IJ SOLN
INTRAMUSCULAR | Status: AC
  Filled 2016-09-18: qty 30

## 2016-09-18 MED ORDER — HEPARIN SODIUM (PORCINE) 5000 UNIT/ML IJ SOLN
5000.0000 [IU] | Freq: Three times a day (TID) | INTRAMUSCULAR | Status: DC
  Administered 2016-09-18 – 2016-09-20 (×5): 5000 [IU] via SUBCUTANEOUS
  Filled 2016-09-18 (×5): qty 1

## 2016-09-18 MED ORDER — LORAZEPAM 2 MG/ML IJ SOLN
0.5000 mg | Freq: Three times a day (TID) | INTRAMUSCULAR | Status: DC | PRN
  Administered 2016-09-19 – 2016-09-23 (×7): 0.5 mg via INTRAVENOUS
  Filled 2016-09-18 (×8): qty 1

## 2016-09-18 MED ORDER — FUROSEMIDE 10 MG/ML IJ SOLN: 40.0000 mg | Freq: Once | INTRAMUSCULAR | Status: DC

## 2016-09-18 MED ORDER — AZITHROMYCIN 500 MG PO TABS
500.0000 mg | ORAL_TABLET | Freq: Every day | ORAL | Status: AC
  Administered 2016-09-18: 500 mg via ORAL
  Filled 2016-09-18 (×2): qty 1

## 2016-09-18 MED ORDER — SODIUM CHLORIDE 0.9 % IV SOLN
250.0000 mL | INTRAVENOUS | Status: DC | PRN
  Administered 2016-09-23: 500 mL via INTRAVENOUS

## 2016-09-18 MED ORDER — SODIUM CHLORIDE 0.9 % IV SOLN: INTRAVENOUS | Status: DC

## 2016-09-18 MED ORDER — SODIUM CHLORIDE 0.45 % IV SOLN
INTRAVENOUS | Status: DC
  Administered 2016-09-18 – 2016-09-19 (×2): via INTRAVENOUS

## 2016-09-18 MED ORDER — SODIUM CHLORIDE 0.9 % IV SOLN
1.0000 g | Freq: Three times a day (TID) | INTRAVENOUS | Status: DC
  Administered 2016-09-18 – 2016-09-21 (×10): 1 g via INTRAVENOUS
  Filled 2016-09-18 (×11): qty 1

## 2016-09-18 SURGICAL SUPPLY — 43 items
APPLIER CLIP 5 13 M/L LIGAMAX5 (MISCELLANEOUS) ×3
BLADE SURG 15 STRL LF DISP TIS (BLADE) ×1 IMPLANT
BLADE SURG 15 STRL SS (BLADE) ×2
BLADE SURG SZ11 CARB STEEL (BLADE) ×3 IMPLANT
CANISTER SUCT 1200ML W/VALVE (MISCELLANEOUS) ×3 IMPLANT
CATH CHOLANGI 4FR 420404F (CATHETERS) IMPLANT
CHLORAPREP W/TINT 10.5 ML (MISCELLANEOUS) ×3 IMPLANT
CLIP APPLIE 5 13 M/L LIGAMAX5 (MISCELLANEOUS) ×1 IMPLANT
CONRAY 60ML FOR OR (MISCELLANEOUS) ×3 IMPLANT
DEFOGGER SCOPE WARMER CLEARIFY (MISCELLANEOUS) ×3 IMPLANT
DRAPE C-ARM XRAY 36X54 (DRAPES) IMPLANT
ELECT CAUTERY BLADE 6.4 (BLADE) ×3 IMPLANT
ELECT REM PT RETURN 9FT ADLT (ELECTROSURGICAL) ×3
ELECTRODE REM PT RTRN 9FT ADLT (ELECTROSURGICAL) ×1 IMPLANT
ENDOPOUCH RETRIEVER 10 (MISCELLANEOUS) ×3 IMPLANT
GLOVE PROTEXIS LATEX SZ 7.5 (GLOVE) ×3 IMPLANT
GLOVE SURG SYN 7.0 (GLOVE) ×3 IMPLANT
GOWN STRL REUS W/ TWL LRG LVL3 (GOWN DISPOSABLE) ×2 IMPLANT
GOWN STRL REUS W/TWL LRG LVL3 (GOWN DISPOSABLE) ×4
IRRIGATION STRYKERFLOW (MISCELLANEOUS) ×1 IMPLANT
IRRIGATOR STRYKERFLOW (MISCELLANEOUS) ×3
IV CATH ANGIO 12GX3 LT BLUE (NEEDLE) ×3 IMPLANT
IV NS 1000ML (IV SOLUTION) ×2
IV NS 1000ML BAXH (IV SOLUTION) ×1 IMPLANT
JACKSON PRATT 10 (INSTRUMENTS) IMPLANT
L-HOOK LAP DISP 36CM (ELECTROSURGICAL) ×3
LABEL OR SOLS (LABEL) ×3 IMPLANT
LHOOK LAP DISP 36CM (ELECTROSURGICAL) ×1 IMPLANT
LIQUID BAND (GAUZE/BANDAGES/DRESSINGS) ×3 IMPLANT
NDL HPO THNWL 1X22GA REG BVL (NEEDLE) ×1 IMPLANT
NDL SAFETY 22GX1.5 (NEEDLE) ×3 IMPLANT
NEEDLE SAFETY 22GX1 (NEEDLE) ×2
PACK LAP CHOLECYSTECTOMY (MISCELLANEOUS) ×3 IMPLANT
PENCIL ELECTRO HAND CTR (MISCELLANEOUS) ×3 IMPLANT
SCISSORS METZENBAUM CVD 33 (INSTRUMENTS) ×3 IMPLANT
SLEEVE ADV FIXATION 5X100MM (TROCAR) ×9 IMPLANT
SPONGE EXCIL AMD DRAIN 4X4 6P (MISCELLANEOUS) IMPLANT
SUT MNCRL 4-0 (SUTURE) ×2
SUT MNCRL 4-0 27XMFL (SUTURE) ×1
SUT VICRYL 0 AB UR-6 (SUTURE) ×6 IMPLANT
SUTURE MNCRL 4-0 27XMF (SUTURE) ×1 IMPLANT
TROCAR 130MM GELPORT  DAV (MISCELLANEOUS) ×3 IMPLANT
TUBING INSUFFLATOR HI FLOW (MISCELLANEOUS) ×3 IMPLANT

## 2016-09-18 NOTE — Consult Note (Signed)
[facility] [place] [facility] Consultation      Assessment and Plan:  A: -Acute hypoxemic respiratory failure. Suspect pneumonia, likely related to underlying acute cholecystitis. -Sepsis, secondary to above. -Right lower lobe atelectasis with elevated right sided diaphragm. -History of autism.  P: Broad-spectrum antibiotics. Check CT chest to rule out pulmonary embolism. Can proceed with cholecystectomy when patient is clinically stable, or the patient's cholecystitis progresses. Otherwise, may require cholecystostomy tube. If the patient's symptoms worsen.  Date: 09/18/2016  MRN# [id number] Joshua [name] Apr 21, 2005  Referring Physician: Dr. Elpidio Anis.   Joshua [name] is a 20 y.o. old male seen in consultation for chief complaint of:    Chief Complaint  Patient presents with  . Abdominal Pain    HPI:  Patient is a 20 year old male, he is accompanied by his mother. Patient presented to the hospital with increasing right upper quadrant abdominal pain. CT abdomen was performed on 09/17/16, suggestive of acute cholecystitis. Over the last 24 hours. Patient's oxygen saturations have dropped initially was on room air. Subsequently, this has been increased to 6 L via nasal cannula. The patient complains of mild dyspnea, however, he does not look to be in any respiratory distress.  On review of the patient's imaging. There does appear to be an elevated right-sided diaphragm, possible new right sided infiltrates, when combined with the presence of fever. This may be suggestive of pneumonia.   PMHX:   Past Medical History:  Diagnosis Date  . Autism   . Irritable bowel syndrome    Surgical Hx:  Past Surgical History:  Procedure Laterality Date  . EYE SURGERY     Family Hx:  History reviewed. No pertinent family history. Social Hx:   Social History  Substance Use Topics  . Smoking status: Never Smoker  . Smokeless tobacco: Never Used  . Alcohol use Not on file   Medication:        Allergies:  Review of patient's allergies indicates no known allergies.  Review of Systems: Gen:  Denies  fever, sweats, chills HEENT: Denies blurred vision, double vision.  Cvc:  No dizziness, chest pain. Resp:   Denies cough or sputum production Gi: Denies swallowing difficulty,  Gu:  Denies bladder incontinence, burning urine Ext:   No Joint pain, stiffness. Skin: No skin rash,  hives  Endoc:  No polyuria, polydipsia. Psych: No depression, insomnia. Other:  All other systems were reviewed with the patient and were negative other that what is mentioned in the HPI.   Physical Examination:   VS: BP (!) 142/89 (BP Location: Right Arm)   Pulse (!) 113   Temp (!) 100.4 F (38 C) (Oral)   Resp (!) 31   Ht 5' (1.524 m)   Wt 213 lb 13.5 oz (97 kg)   SpO2 95%   BMI 41.76 kg/m   General Appearance: No distress  Neuro:without focal findings,  speech normal,  HEENT: PERRLA, EOM intact.   Pulmonary: normal breath sounds, No wheezing. Decreased air entry in both lung bases. CardiovascularNormal S1,S2.  No m/r/g.   Abdomen: Benign, Soft, non-tender. Renal:  No costovertebral tenderness  GU:  No performed at this time. Endoc: No evident thyromegaly, no signs of acromegaly. Skin:   warm, no rashes, no ecchymosis  Extremities: normal, no cyanosis, clubbing.  Other findings:    LABORATORY PANEL:   CBC  Recent Labs Lab 09/18/16 0601  WBC 21.5*  HGB 14.9  HCT 42.1  PLT 193   ------------------------------------------------------------------------------------------------------------------  Chemistries   Recent  Labs Lab 09/18/16 0601  NA 135  K 3.9  CL 101  CO2 28  GLUCOSE 94  BUN 12  CREATININE 1.10  CALCIUM 8.5*  AST 16  ALT 23  ALKPHOS 70  BILITOT 1.5*   ------------------------------------------------------------------------------------------------------------------  Cardiac Enzymes  Recent Labs Lab 09/17/16 0918  TROPONINI <0.03    ------------------------------------------------------------  RADIOLOGY:  Dg Chest 1 View  Result Date: 09/18/2016 CLINICAL DATA:  Shortness of breath and right upper abdominal pain. EXAM: CHEST 1 VIEW COMPARISON:  09/17/2016 FINDINGS: Lung volumes are lower compared to the prior study with atelectasis present bilaterally, especially in the right perihilar lung. No overt edema or pleural effusion. The heart size and mediastinal contours are normal. IMPRESSION: Lower lung volumes with more prominent atelectasis bilaterally, most prominently in the right perihilar region. Electronically Signed   By: Irish Lack M.D.   On: 09/18/2016 12:13   Dg Chest 2 View  Result Date: 09/17/2016 CLINICAL DATA:  Pt has a fever and is having RUQ pain for a couple of days now. Nonsmoker. No heart or lung problems. EXAM: CHEST  2 VIEW COMPARISON:  None. FINDINGS: The heart size and mediastinal contours are within normal limits. Both lungs are clear. No pleural effusion or pneumothorax. The visualized skeletal structures are unremarkable. IMPRESSION: No active cardiopulmonary disease. Electronically Signed   By: Amie Portland M.D.   On: 09/17/2016 11:53   Ct Angio Chest Pe W Or Wo Contrast  Result Date: 09/18/2016 CLINICAL DATA:  Initial evaluation for acute dyspnea. EXAM: CT ANGIOGRAPHY CHEST WITH CONTRAST TECHNIQUE: Multidetector CT imaging of the chest was performed using the standard protocol during bolus administration of intravenous contrast. Multiplanar CT image reconstructions and MIPs were obtained to evaluate the vascular anatomy. CONTRAST:  75 cc of Isovue 370. COMPARISON:  Prior radiograph from earlier same day. FINDINGS: Cardiovascular: Intrathoracic aorta of normal caliber and appearance without acute abnormality. Visualized great vessels within normal limits. Heart size normal. No pericardial effusion. Pulmonary arteries adequately opacified for evaluation. Main pulmonary artery measures within normal  limits for size at 2.2 cm in diameter. No filling defect to suggest acute pulmonary embolism. Re-formatted imaging confirms these findings. Mediastinum/Nodes: The visualized thyroid is normal. No pathologically enlarged mediastinal or hilar lymph nodes identified. No axillary adenopathy. Esophagus within normal limits. Lungs/Pleura: Patchy multi focal consolidation present within the posterior aspect of both upper and lower lobes, concerning for multifocal pneumonia. Changes most severe on the right. Findings may be infectious nature, although possible aspiration could be considered. Associated small right pleural effusion. No pulmonary edema. No pneumothorax. No worrisome pulmonary nodule or mass. Tracheobronchial tree remains patent. Upper Abdomen: Visualized portions of the upper abdomen are unremarkable. Musculoskeletal: No acute osseous abnormality. No worrisome lytic or blastic osseous lesions. Review of the MIP images confirms the above findings. IMPRESSION: 1. No CT evidence for acute pulmonary embolism. 2. Multi focal areas of consolidation involving both the right upper and lower lobes, concerning for multifocal pneumonia. Changes are slightly worse on the right. Findings may be infectious in nature. Possible aspiration pneumonitis could also be considered. 3. Small right pleural effusion. Electronically Signed   By: Rise Mu M.D.   On: 09/18/2016 14:27   Ct Abdomen Pelvis W Contrast  Result Date: 09/17/2016 CLINICAL DATA:  Pt presents to ED with reports of RUQ abdominal pain. Pt denies NVD. Pt reports he had a small bowel movement yesterday but thinks he might be constipated. Pt reports decreased appetite EXAM: CT ABDOMEN AND PELVIS WITH  CONTRAST TECHNIQUE: Multidetector CT imaging of the abdomen and pelvis was performed using the standard protocol following bolus administration of intravenous contrast. CONTRAST:  100mL ISOVUE-300 IOPAMIDOL (ISOVUE-300) INJECTION 61% COMPARISON:  Right  upper quadrant ultrasound, 09/17/2016 FINDINGS: Lower chest: No acute abnormality. Hepatobiliary: Despite the unremarkable appearance of the gallbladder on the current ultrasound, on CT, the gallbladder is distended. Wall is prominent but not overtly thickened. There is pericholecystic inflammation. A stone projects in the gallbladder neck. The liver is unremarkable. There is no bile duct dilation. Pancreas: Unremarkable. No pancreatic ductal dilatation or surrounding inflammatory changes. Spleen: Normal in size without focal abnormality. Adrenals/Urinary Tract: Adrenal glands are unremarkable. Kidneys are normal, without renal calculi, focal lesion, or hydronephrosis. Bladder is unremarkable. Stomach/Bowel: Stomach is within normal limits. Appendix appears normal. No evidence of bowel wall thickening, distention, or inflammatory changes. Vascular/Lymphatic: No significant vascular findings are present. No enlarged abdominal or pelvic lymph nodes. Reproductive: Prostate is unremarkable. Other: No abdominal wall hernia or abnormality. No abdominopelvic ascites. Musculoskeletal: Unremarkable. IMPRESSION: 1. Gallbladder is distended with pericholecystic inflammation as well as a gallstone that projects in the gallbladder neck. Despite the normal appearance of the gallbladder on the current ultrasound, the findings strongly suggest acute cholecystitis. No bile duct stone or biliary dilation. 2. No other acute abnormality within the abdomen pelvis. Exam is otherwise unremarkable. Electronically Signed   By: Amie Portlandavid  Ormond M.D.   On: 09/17/2016 11:59   [place] Abdomen Limited Ruq  Result Date: 09/17/2016 CLINICAL DATA:  One day history of right upper quadrant pain EXAM: US ABDOMEN LIMITED - RIGHT UPPER QUADRANT COMPARISON:  None. FINDINGS: Gallbladder: No gallstones or wall thickening visualized. There is no pericholecystic fluid. No sonographic Murphy sign noted by sonographer. Common bile duct: Diameter: 4 mm. No  intrahepatic or extrahepatic biliary duct dilatation. Liver: No focal lesion identified. Within normal limits in parenchymal echogenicity. IMPRESSION: Study within normal limits. Electronically Signed   By: Bretta BangWilliam  Woodruff III M.D.   On: 09/17/2016 10:36       Thank  you for the consultation and for allowing [facility] to assist in the care of your patient. Our recommendations are noted above.  Please contact us if we can be of further service.   Wells Guileseep Madeleine Fenn, MD.  Board Certified in Internal Medicine, Pulmonary Medicine, Critical Care Medicine, and Sleep Medicine.  [facility] Office Number: [phone]  Santiago Gladavid Kasa, M.D.  Stephanie AcreVishal Mungal, M.D.  Billy Fischeravid Simonds, M.D  09/18/2016

## 2016-09-18 NOTE — Consult Note (Signed)
[facility] [place] [facility]   PATIENT NAME: Joshua SenterCody Schleicher    MR#:  [id number]  DATE OF BIRTH:  [birth year]  DATE OF ADMISSION:  09/17/2016  PRIMARY CARE PHYSICIAN: No PCP Per Patient   REQUESTING/REFERRING PHYSICIAN:  Henrene DodgeJose Piscoya MD  CHIEF COMPLAINT:   Chief Complaint  Patient presents with  . Abdominal Pain    HISTORY OF PRESENT ILLNESS: Joshua SenterCody Torti  is a 20 y.o. male with a known history of  Autism and Irritable bowel syndrome who was admitted with acute cholecystitis. Patient initial chest x-ray was negative he was not requiring any oxygen. However he began not to feel too well earlier today. The nurse checked on him he appeared to be gray. His oxygen saturation was noted to be in the 70s currently he is on 6 L oxygen. Patient himself states that he is not feeling well but denies any shortness of breath or chest pain.        PAST MEDICAL HISTORY:   Past Medical History:  Diagnosis Date  . Autism   . Irritable bowel syndrome     PAST SURGICAL HISTORY: Past Surgical History:  Procedure Laterality Date  . EYE SURGERY      SOCIAL HISTORY:  Social History  Substance Use Topics  . Smoking status: Never Smoker  . Smokeless tobacco: Never Used  . Alcohol use Not on file    FAMILY HISTORY: History reviewed. No pertinent family history.  DRUG ALLERGIES: No Known Allergies  REVIEW OF SYSTEMS:   CONSTITUTIONAL: Positive fever, fatigue or weakness.  EYES: No blurred or double vision.  EARS, NOSE, AND THROAT: No tinnitus or ear pain.  RESPIRATORY: No cough, shortness of breath, wheezing or hemoptysis.  CARDIOVASCULAR: No chest pain, orthopnea, edema.  GASTROINTESTINAL: No nausea, vomiting, diarrhea positive abdominal pain.  GENITOURINARY: No dysuria, hematuria.  ENDOCRINE: No polyuria, nocturia,  HEMATOLOGY: No anemia, easy bruising or bleeding SKIN: No rash or lesion. MUSCULOSKELETAL: No joint pain or arthritis.   NEUROLOGIC: No  tingling, numbness, weakness.  PSYCHIATRY: No anxiety or depression.   MEDICATIONS AT HOME:  Prior to Admission medications   Not on File      PHYSICAL EXAMINATION:   VITAL SIGNS: Blood pressure (!) 168/68, pulse (!) 118, temperature 98.4 F (36.9 C), resp. rate (!) 36, height 5\' 4"  (1.626 m), weight 207 lb (93.9 kg), SpO2 (!) 88 %.  GENERAL:  20 y.o.-year-old patient lying in the bed with no acute distress.  EYES: Pupils equal, round, reactive to light and accommodation. No scleral icterus. Extraocular muscles intact.  HEENT: Head atraumatic, normocephalic. Oropharynx and nasopharynx clear.  NECK:  Supple, no jugular venous distention. No thyroid enlargement, no tenderness.  LUNGS: Occasional crackles in the posterior aspect of both lungs CARDIOVASCULAR: S1, S2 normal. No murmurs, rubs, or gallops.  ABDOMEN: Soft, right upper quadrant tenderness no guarding nondistended. Bowel sounds present. No organomegaly or mass.  EXTREMITIES: No pedal edema, cyanosis, or clubbing.  NEUROLOGIC: Cranial nerves II through XII are intact. Muscle strength 5/5 in all extremities. Sensation intact. Gait not checked.  PSYCHIATRIC: The patient is alert and oriented x 3.  SKIN: No obvious rash, lesion, or ulcer.   LABORATORY PANEL:   CBC  Recent Labs Lab 09/17/16 0918 09/18/16 0601  WBC 21.9* 21.5*  HGB 16.9 14.9  HCT 47.8 42.1  PLT 259 193  MCV 86.0 87.1  MCH 30.3 30.8  MCHC 35.3 35.4  RDW 12.8 12.9   ------------------------------------------------------------------------------------------------------------------  Chemistries  Recent Labs Lab 09/17/16 0918 09/18/16 0601  NA 132* 135  K 3.7 3.9  CL 98* 101  CO2 25 28  GLUCOSE 123* 94  BUN 13 12  CREATININE 1.02 1.10  CALCIUM 9.3 8.5*  AST 24 16  ALT 34 23  ALKPHOS 93 70  BILITOT 1.2 1.5*   ------------------------------------------------------------------------------------------------------------------ estimated  creatinine clearance is 109.8 mL/min (by C-G formula based on SCr of 1.1 mg/dL). ------------------------------------------------------------------------------------------------------------------ No results for input(s): TSH, T4TOTAL, T3FREE, THYROIDAB in the last 72 hours.  Invalid input(s): FREET3   Coagulation profile  Recent Labs Lab 09/17/16 0918  INR 1.07   ------------------------------------------------------------------------------------------------------------------- No results for input(s): DDIMER in the last 72 hours. -------------------------------------------------------------------------------------------------------------------  Cardiac Enzymes  Recent Labs Lab 09/17/16 0918  TROPONINI <0.03   ------------------------------------------------------------------------------------------------------------------ Invalid input(s): POCBNP  ---------------------------------------------------------------------------------------------------------------  Urinalysis    Component Value Date/Time   COLORURINE YELLOW (A) 09/17/2016 1040   APPEARANCEUR CLEAR (A) 09/17/2016 1040   LABSPEC 1.011 09/17/2016 1040   PHURINE 6.0 09/17/2016 1040   GLUCOSEU NEGATIVE 09/17/2016 1040   HGBUR NEGATIVE 09/17/2016 1040   BILIRUBINUR NEGATIVE 09/17/2016 1040   KETONESUR NEGATIVE 09/17/2016 1040   PROTEINUR NEGATIVE 09/17/2016 1040   NITRITE NEGATIVE 09/17/2016 1040   LEUKOCYTESUR NEGATIVE 09/17/2016 1040     RADIOLOGY: Dg Chest 2 View  Result Date: 09/17/2016 CLINICAL DATA:  Pt has a fever and is having RUQ pain for a couple of days now. Nonsmoker. No heart or lung problems. EXAM: CHEST  2 VIEW COMPARISON:  None. FINDINGS: The heart size and mediastinal contours are within normal limits. Both lungs are clear. No pleural effusion or pneumothorax. The visualized skeletal structures are unremarkable. IMPRESSION: No active cardiopulmonary disease. Electronically Signed   By: Amie Portland M.D.   On: 09/17/2016 11:53   Ct Abdomen Pelvis W Contrast  Result Date: 09/17/2016 CLINICAL DATA:  Pt presents to ED with reports of RUQ abdominal pain. Pt denies NVD. Pt reports he had a small bowel movement yesterday but thinks he might be constipated. Pt reports decreased appetite EXAM: CT ABDOMEN AND PELVIS WITH CONTRAST TECHNIQUE: Multidetector CT imaging of the abdomen and pelvis was performed using the standard protocol following bolus administration of intravenous contrast. CONTRAST:  ISOVUE-300 IOPAMIDOL (ISOVUE-300) INJECTION 61% COMPARISON:  Right upper quadrant ultrasound, 09/17/2016 FINDINGS: Lower chest: No acute abnormality. Hepatobiliary: Despite the unremarkable appearance of the gallbladder on the current ultrasound, on CT, the gallbladder is distended. Wall is prominent but not overtly thickened. There is pericholecystic inflammation. A stone projects in the gallbladder neck. The liver is unremarkable. There is no bile duct dilation. Pancreas: Unremarkable. No pancreatic ductal dilatation or surrounding inflammatory changes. Spleen: Normal in size without focal abnormality. Adrenals/Urinary Tract: Adrenal glands are unremarkable. Kidneys are normal, without renal calculi, focal lesion, or hydronephrosis. Bladder is unremarkable. Stomach/Bowel: Stomach is within normal limits. Appendix appears normal. No evidence of bowel wall thickening, distention, or inflammatory changes. Vascular/Lymphatic: No significant vascular findings are present. No enlarged abdominal or pelvic lymph nodes. Reproductive: Prostate is unremarkable. Other: No abdominal wall hernia or abnormality. No abdominopelvic ascites. Musculoskeletal: Unremarkable. IMPRESSION: 1. Gallbladder is distended with pericholecystic inflammation as well as a gallstone that projects in the gallbladder neck. Despite the normal appearance of the gallbladder on the current ultrasound, the findings strongly suggest acute  cholecystitis. No bile duct stone or biliary dilation. 2. No other acute abnormality within the abdomen pelvis. Exam is otherwise unremarkable. Electronically Signed   By: Renard Hamper.D.  On: 09/17/2016 11:59   US Abdomen Limited Ruq  Result Date: 09/17/2016 CLINICAL DATA:  One day history of right upper quadrant pain EXAM: US ABDOMEN LIMITED - RIGHT UPPER QUADRANT COMPARISON:  None. FINDINGS: Gallbladder: No gallstones or wall thickening visualized. There is no pericholecystic fluid. No sonographic Murphy sign noted by sonographer. Common bile duct: Diameter: 4 mm. No intrahepatic or extrahepatic biliary duct dilatation. Liver: No focal lesion identified. Within normal limits in parenchymal echogenicity. IMPRESSION: Study within normal limits. Electronically Signed   By: Bretta Bang III M.D.   On: 09/17/2016 10:36    EKG: Orders placed or performed during the hospital encounter of 09/17/16  . EKG 12-Lead  . EKG 12-Lead  . ED EKG 12-Lead  . ED EKG 12-Lead  . EKG 12-Lead  . EKG 12-Lead    IMPRESSION AND PLAN: Patient is a 20 year old admitted with acute cholecystitis now acute respiratory failure  1. Acute hypoxic respiratory failure Suspect either due to fluid overload or due to ARDS as a result of underlying infection At this point I will stop fluids, I will await read on the chest x-ray Add BNP to current blood work I have asked the pulmonary physician to come evaluate him due to concern for ARDS Continue oxygen therapy if patient condition worsens will need transfer to the stepdown unit  2. Acute cholecystitis In light of acute respiratory issues would hold off on surgical intervention for today Continue IV antibiotics Further therapy per surgery  3. Misc: lovenox   All the records are reviewed and case discussed with ED provider. Management plans discussed with the patient, family and they are in agreement.  CODE STATUS:    Code Status Orders        Start      Ordered   09/17/16 1458  Full code  Continuous     09/17/16 1457    Code Status History    Date Active Date Inactive Code Status Order ID Comments User Context   This patient has a current code status but no historical code status.       TOTAL TIME TAKING CARE OF THIS PATIENT:55 minutes.    Auburn Bilberry M.D on 09/18/2016 at 11:51 AM  Between 7am to 6pm - Pager - [phone]  After 6pm go to [email or web address] - password [email or web address] [facility]  [facility]  Office  [phone]  CC: Primary care physician; No PCP Per Patient

## 2016-09-18 NOTE — Progress Notes (Signed)
Pharmacy Antibiotic Note  Joshua [name] is a 20 y.o. male admitted on 09/17/2016 with cholecystitis and Klebsiella bacteremia. Patient admitted to ICU for ARDS. Initially on Zosyn but will escalate to meropenem to cover for possible ESBL Klebsiella pneumoniae due to patient's deteriorating clinical condition.   Plan: Meropenem 1 g iv q 8 hours.   Height: 5' (152.4 cm) Weight: 213 lb 13.5 oz (97 kg) IBW/kg (Calculated) : 50  Temp (24hrs), Avg:99.2 F (37.3 C), Min:98.4 F (36.9 C), Max:100.9 F (38.3 C)   Recent Labs Lab 09/17/16 0918 09/17/16 0943 09/18/16 0601  WBC 21.9*  --  21.5*  CREATININE 1.02  --  1.10  LATICACIDVEN  --  1.4  --     Estimated Creatinine Clearance: 103.4 mL/min (by C-G formula based on SCr of 1.1 mg/dL).    No Known Allergies  Antimicrobials this admission: Zosyn 10/8 >> 10/9 Meropenem 10/9 >>   Dose adjustments this admission:   Microbiology results: 10/8 BCx: GNR with BCID Klebsiella pneumoniae 10/8 MRSA PCR: negative  Thank you for allowing pharmacy to be a part of this patient's care.  Joshua [name], Joshua [name] 09/18/2016 2:33 PM

## 2016-09-18 NOTE — Progress Notes (Signed)
Telephone report called to Nia, Charity fundraiserN.  Pt transported via bed to ICU 3.

## 2016-09-18 NOTE — Progress Notes (Signed)
Patient called out asking for pain medication.  Upon entering room patient with chills and looked gray.  Vital signs taken and revealed hypoxia.  Oxygen incresed from 2L nasal cannula to 6L.  Dr. Aleen CampiPiscoya called and notified.  Verbal orders obtained for stat ABG, chest x-ray, and stat medical consult.

## 2016-09-18 NOTE — Progress Notes (Signed)
  Echocardiogram 2D Echocardiogram has been performed.  Joshua SavoyCasey [name] [name] 09/18/2016, 3:42 PM

## 2016-09-18 NOTE — Progress Notes (Signed)
09/18/2016  Subjective: No acute events overnight. Patient's pain is improving although has been feeling feverish subjectively. No nausea or vomiting.  Vital signs: Temp:  [98.3 F (36.8 C)-100.9 F (38.3 C)] 98.6 F (37 C) (10/09 0800) Pulse Rate:  [99-137] 107 (10/09 0533) Resp:  [18-41] 20 (10/09 0533) BP: (114-156)/(65-98) 121/65 (10/09 0533) SpO2:  [89 %-100 %] 92 % (10/09 0533) Weight:  [93.9 kg (207 lb)-95 kg (209 lb 7 oz)] 93.9 kg (207 lb) (10/08 1600)   Intake/Output: 10/08 0701 - 10/09 0700 In: 4070 [I.V.:1870; IV Piggyback:2200] Out: 250 [Urine:250]    Physical Exam: Constitutional: No acute distress Cardiac:  Low grade tachycardia Pulm: Lungs are clear bilaterally, no respiratory distress Abdomen: Soft nondistended, with tenderness to palpation the right upper quadrant.  Labs:   Recent Labs  09/17/16 0918 09/18/16 0601  WBC 21.9* 21.5*  HGB 16.9 14.9  HCT 47.8 42.1  PLT 259 193    Recent Labs  09/17/16 0918 09/18/16 0601  NA 132* 135  K 3.7 3.9  CL 98* 101  CO2 25 28  GLUCOSE 123* 94  BUN 13 12  CREATININE 1.02 1.10  CALCIUM 9.3 8.5*    Recent Labs  09/17/16 0918  LABPROT 13.9  INR 1.07    Imaging: Dg Chest 2 View  Result Date: 09/17/2016 CLINICAL DATA:  Pt has a fever and is having RUQ pain for a couple of days now. Nonsmoker. No heart or lung problems. EXAM: CHEST  2 VIEW COMPARISON:  None. FINDINGS: The heart size and mediastinal contours are within normal limits. Both lungs are clear. No pleural effusion or pneumothorax. The visualized skeletal structures are unremarkable. IMPRESSION: No active cardiopulmonary disease. Electronically Signed   By: Amie Portland M.D.   On: 09/17/2016 11:53   Ct Abdomen Pelvis W Contrast  Result Date: 09/17/2016 CLINICAL DATA:  Pt presents to ED with reports of RUQ abdominal pain. Pt denies NVD. Pt reports he had a small bowel movement yesterday but thinks he might be constipated. Pt reports decreased  appetite EXAM: CT ABDOMEN AND PELVIS WITH CONTRAST TECHNIQUE: Multidetector CT imaging of the abdomen and pelvis was performed using the standard protocol following bolus administration of intravenous contrast. CONTRAST:  ISOVUE-300 IOPAMIDOL (ISOVUE-300) INJECTION 61% COMPARISON:  Right upper quadrant ultrasound, 09/17/2016 FINDINGS: Lower chest: No acute abnormality. Hepatobiliary: Despite the unremarkable appearance of the gallbladder on the current ultrasound, on CT, the gallbladder is distended. Wall is prominent but not overtly thickened. There is pericholecystic inflammation. A stone projects in the gallbladder neck. The liver is unremarkable. There is no bile duct dilation. Pancreas: Unremarkable. No pancreatic ductal dilatation or surrounding inflammatory changes. Spleen: Normal in size without focal abnormality. Adrenals/Urinary Tract: Adrenal glands are unremarkable. Kidneys are normal, without renal calculi, focal lesion, or hydronephrosis. Bladder is unremarkable. Stomach/Bowel: Stomach is within normal limits. Appendix appears normal. No evidence of bowel wall thickening, distention, or inflammatory changes. Vascular/Lymphatic: No significant vascular findings are present. No enlarged abdominal or pelvic lymph nodes. Reproductive: Prostate is unremarkable. Other: No abdominal wall hernia or abnormality. No abdominopelvic ascites. Musculoskeletal: Unremarkable. IMPRESSION: 1. Gallbladder is distended with pericholecystic inflammation as well as a gallstone that projects in the gallbladder neck. Despite the normal appearance of the gallbladder on the current ultrasound, the findings strongly suggest acute cholecystitis. No bile duct stone or biliary dilation. 2. No other acute abnormality within the abdomen pelvis. Exam is otherwise unremarkable. Electronically Signed   By: Amie Portland M.D.   On: 09/17/2016  11:59   [place] Abdomen Limited Ruq  Result Date: 09/17/2016 CLINICAL DATA:  One day  history of right upper quadrant pain EXAM: US ABDOMEN LIMITED - RIGHT UPPER QUADRANT COMPARISON:  None. FINDINGS: Gallbladder: No gallstones or wall thickening visualized. There is no pericholecystic fluid. No sonographic Murphy sign noted by sonographer. Common bile duct: Diameter: 4 mm. No intrahepatic or extrahepatic biliary duct dilatation. Liver: No focal lesion identified. Within normal limits in parenchymal echogenicity. IMPRESSION: Study within normal limits. Electronically Signed   By: Bretta BangWilliam  Woodruff III M.D.   On: 09/17/2016 10:36    Assessment/Plan: 20 year old male with acute cholecystitis and Klebsiella bacteremia  -We'll take patient today to the operating room for lumbar scopic cholecystectomy. We have discussed with the patient the risks and benefits of the procedure and he is willing to proceed. -We'll continue IV Zosyn for his bacteremia.   Howie IllJose Luis Chriss Mannan, MD [facility]

## 2016-09-18 NOTE — Progress Notes (Signed)
PHARMACY - PHYSICIAN COMMUNICATION CRITICAL VALUE ALERT - BLOOD CULTURE IDENTIFICATION (BCID)  Results for orders placed or performed during the hospital encounter of 09/17/16  Blood Culture ID Panel (Reflexed) (Collected: 09/17/2016  9:44 AM)  Result Value Ref Range   Enterococcus species NOT DETECTED NOT DETECTED   Listeria monocytogenes NOT DETECTED NOT DETECTED   Staphylococcus species NOT DETECTED NOT DETECTED   Staphylococcus aureus NOT DETECTED NOT DETECTED   Streptococcus species NOT DETECTED NOT DETECTED   Streptococcus agalactiae NOT DETECTED NOT DETECTED   Streptococcus pneumoniae NOT DETECTED NOT DETECTED   Streptococcus pyogenes NOT DETECTED NOT DETECTED   Acinetobacter baumannii NOT DETECTED NOT DETECTED   Enterobacteriaceae species DETECTED (A) NOT DETECTED   Enterobacter cloacae complex NOT DETECTED NOT DETECTED   Escherichia coli NOT DETECTED NOT DETECTED   Klebsiella oxytoca NOT DETECTED NOT DETECTED   Klebsiella pneumoniae DETECTED (A) NOT DETECTED   Proteus species NOT DETECTED NOT DETECTED   Serratia marcescens NOT DETECTED NOT DETECTED   Carbapenem resistance NOT DETECTED NOT DETECTED   Haemophilus influenzae NOT DETECTED NOT DETECTED   Neisseria meningitidis NOT DETECTED NOT DETECTED   Pseudomonas aeruginosa NOT DETECTED NOT DETECTED   Candida albicans NOT DETECTED NOT DETECTED   Candida glabrata NOT DETECTED NOT DETECTED   Candida krusei NOT DETECTED NOT DETECTED   Candida parapsilosis NOT DETECTED NOT DETECTED   Candida tropicalis NOT DETECTED NOT DETECTED    Name of physician (or Provider) Contacted: Cooper  Changes to prescribed antibiotics required: n/a. Patient already on Zosyn for cholecystitis.  Riann Oman S 09/18/2016  3:21 AM

## 2016-09-18 NOTE — Progress Notes (Signed)
Dr. Allena KatzPatel notified and will see patient

## 2016-09-19 ENCOUNTER — Encounter: Payer: Self-pay | Admitting: Radiology

## 2016-09-19 ENCOUNTER — Inpatient Hospital Stay: Payer: Medicaid Other

## 2016-09-19 DIAGNOSIS — K810 Acute cholecystitis: Secondary | ICD-10-CM

## 2016-09-19 DIAGNOSIS — J150 Pneumonia due to Klebsiella pneumoniae: Secondary | ICD-10-CM

## 2016-09-19 LAB — COMPREHENSIVE METABOLIC PANEL
ALT: 20 U/L (ref 17–63)
AST: 17 U/L (ref 15–41)
Albumin: 2.9 g/dL — ABNORMAL LOW (ref 3.5–5.0)
Alkaline Phosphatase: 63 U/L (ref 38–126)
Anion gap: 7 (ref 5–15)
BILIRUBIN TOTAL: 1.2 mg/dL (ref 0.3–1.2)
BUN: 12 mg/dL (ref 6–20)
CHLORIDE: 100 mmol/L — AB (ref 101–111)
CO2: 26 mmol/L (ref 22–32)
CREATININE: 1 mg/dL (ref 0.61–1.24)
Calcium: 8.4 mg/dL — ABNORMAL LOW (ref 8.9–10.3)
GFR calc non Af Amer: >60 mL/min (ref >60)
Glucose, Bld: 94 mg/dL (ref 65–99)
POTASSIUM: 3.7 mmol/L (ref 3.5–5.1)
SODIUM: 133 mmol/L — AB (ref 135–145)
TOTAL PROTEIN: 6 g/dL — AB (ref 6.5–8.1)

## 2016-09-19 LAB — CBC
HCT: 40.8 % (ref 40.0–52.0)
HEMOGLOBIN: 14.4 g/dL (ref 13.0–18.0)
MCH: 30.7 pg (ref 26.0–34.0)
MCHC: 35.4 g/dL (ref 32.0–36.0)
MCV: 86.8 fL (ref 80.0–100.0)
PLATELETS: 185 10*3/uL (ref 150–440)
RBC: 4.7 MIL/uL (ref 4.40–5.90)
RDW: 12.6 % (ref 11.5–14.5)
WBC: 15.2 10*3/uL — ABNORMAL HIGH (ref 3.8–10.6)

## 2016-09-19 MED ORDER — MORPHINE SULFATE (PF) 4 MG/ML IV SOLN
3.0000 mg | Freq: Once | INTRAVENOUS | Status: AC
Start: 2016-09-19 — End: 2016-09-19
  Administered 2016-09-19: 3 mg via INTRAVENOUS
  Filled 2016-09-19: qty 1

## 2016-09-19 MED ORDER — OXYCODONE HCL 5 MG PO TABS
5.0000 mg | ORAL_TABLET | ORAL | Status: DC | PRN
  Administered 2016-09-20 – 2016-09-22 (×4): 10 mg via ORAL
  Administered 2016-09-23: 5 mg via ORAL
  Filled 2016-09-19: qty 1
  Filled 2016-09-19 (×4): qty 2

## 2016-09-19 MED ORDER — NALOXONE HCL 0.4 MG/ML IJ SOLN: 0.4000 mg | INTRAMUSCULAR | Status: DC | PRN

## 2016-09-19 MED ORDER — TECHNETIUM TC 99M MEBROFENIN IV KIT
5.0000 | PACK | Freq: Once | INTRAVENOUS | Status: AC | PRN
  Administered 2016-09-19: 4.938 via INTRAVENOUS

## 2016-09-19 MED ORDER — ACETAMINOPHEN 325 MG PO TABS: 650.0000 mg | ORAL_TABLET | ORAL | Status: DC | PRN

## 2016-09-19 MED ORDER — SODIUM CHLORIDE 0.9 % IV SOLN
INTRAVENOUS | Status: AC
  Administered 2016-09-19 (×2): via INTRAVENOUS

## 2016-09-19 MED ORDER — TECHNETIUM TC 99M MEBROFENIN IV KIT
2.0000 | PACK | Freq: Once | INTRAVENOUS | Status: AC | PRN
  Administered 2016-09-19: 2.152 via INTRAVENOUS

## 2016-09-19 MED ORDER — NALOXONE HCL 0.4 MG/ML IJ SOLN
INTRAMUSCULAR | Status: AC
  Filled 2016-09-19: qty 1

## 2016-09-19 NOTE — Care Management (Signed)
Patient admitted with acute cholecystitis and required  transferre to icu stepdown for hypoxia .  Suspect pneumonia with sepsis.  Currently on nasal cannula and IV antibiotics.  he does have history of autism.  It appears that he has presented from a home environment

## 2016-09-19 NOTE — Consult Note (Signed)
[facility] Consultation      Assessment and Plan:  A: -Acute hypoxemic respiratory failure. Suspect pneumonia, likely related to underlying acute cholecystitis. -Sepsis, secondary to above. -Right lower lobe atelectasis with elevated right sided diaphragm. -History of autism.  P: Broad-spectrum antibiotics. Meropenem 10/10> CTA chest with no PE on 10/9 Spoke with surgery (Dr. Evette Cristal), recommend HIDA scan, if positive then setup for perc chole tubes.    Date: 09/19/2016  MRN# [id number] Joshua [name] [birth year]  Referring Physician: Dr. Elpidio Anis.   Joshua [name] is a 20 y.o. old male seen in consultation for chief complaint of:    Chief Complaint  Patient presents with  . Abdominal Pain    HPI:  Patient is a 20 year old male, he is accompanied by his mother. Patient presented to the hospital with increasing right upper quadrant abdominal pain. CT abdomen was performed on 09/17/16, suggestive of acute cholecystitis. Over the last 24 hours. Patient's oxygen saturations have dropped initially was on room air. Subsequently, this has been increased to 6 L via nasal cannula. The patient complains of mild dyspnea, however, he does not look to be in any respiratory distress.  On review of the patient's imaging. There does appear to be an elevated right-sided diaphragm, possible new right sided infiltrates, when combined with the presence of fever. This may be suggestive of pneumonia.  SUBJ: Milld temp elevations overnight, awake and alert today, moderate abd pain.    PMHX:   Past Medical History:  Diagnosis Date  . Autism   . Irritable bowel syndrome    Surgical Hx:  Past Surgical History:  Procedure Laterality Date  . EYE SURGERY     Family Hx:  History reviewed. No pertinent family history. Social Hx:   Social History  Substance Use Topics  . Smoking status: Never Smoker  . Smokeless tobacco: Never Used  . Alcohol use Not on file   Medication:         Allergies:  Review of patient's allergies indicates no known allergies.  Review of Systems: Gen:  Denies  fever, sweats, chills HEENT: Denies blurred vision, double vision.  Cvc:  No dizziness, chest pain. Resp:   Denies cough or sputum production Gi: Denies swallowing difficulty,  Admits nausea, admits abdominal pain Gu:  Denies bladder incontinence, burning urine Ext:   No Joint pain, stiffness. Skin: No skin rash,  hives  Endoc:  No polyuria, polydipsia. Psych: No depression, insomnia. Other:  All other systems were reviewed with the patient and were negative other that what is mentioned in the HPI.   Physical Examination:   VS: BP (!) 167/80   Pulse (!) 119   Temp 99.7 F (37.6 C)   Resp (!) 36   Ht 5' (1.524 m)   Wt 212 lb 8.4 oz (96.4 kg)   SpO2 91%   BMI 41.51 kg/m   General Appearance: No distress  Neuro:without focal findings,  speech normal,  HEENT: PERRLA, EOM intact.   Pulmonary: normal breath sounds, No wheezing. Decreased air entry in both lung bases. CardiovascularNormal S1,S2.  No m/r/g.   Abdomen: Benign, Soft, moderate RUQ tenderness Renal:  No costovertebral tenderness  GU:  No performed at this time. Endoc: No evident thyromegaly, no signs of acromegaly. Skin:   warm, no rashes, no ecchymosis  Extremities: normal, no cyanosis, clubbing.  Other findings:    LABORATORY PANEL:   CBC  Recent Labs Lab 09/19/16 0433  WBC 15.2*  HGB 14.4  HCT 40.8  PLT 185   ------------------------------------------------------------------------------------------------------------------  Chemistries   Recent Labs Lab 09/19/16 0433  NA 133*  K 3.7  CL 100*  CO2 26  GLUCOSE 94  BUN 12  CREATININE 1.00  CALCIUM 8.4*  AST 17  ALT 20  ALKPHOS 63  BILITOT 1.2   ------------------------------------------------------------------------------------------------------------------  Cardiac Enzymes  Recent Labs Lab 09/17/16 0918  TROPONINI  <0.03   ------------------------------------------------------------  RADIOLOGY:  Dg Chest 1 View  Result Date: 09/18/2016 CLINICAL DATA:  Shortness of breath and right upper abdominal pain. EXAM: CHEST 1 VIEW COMPARISON:  09/17/2016 FINDINGS: Lung volumes are lower compared to the prior study with atelectasis present bilaterally, especially in the right perihilar lung. No overt edema or pleural effusion. The heart size and mediastinal contours are normal. IMPRESSION: Lower lung volumes with more prominent atelectasis bilaterally, most prominently in the right perihilar region. Electronically Signed   By: Irish Lack M.D.   On: 09/18/2016 12:13   Ct Angio Chest Pe W Or Wo Contrast  Result Date: 09/18/2016 CLINICAL DATA:  Initial evaluation for acute dyspnea. EXAM: CT ANGIOGRAPHY CHEST WITH CONTRAST TECHNIQUE: Multidetector CT imaging of the chest was performed using the standard protocol during bolus administration of intravenous contrast. Multiplanar CT image reconstructions and MIPs were obtained to evaluate the vascular anatomy. CONTRAST:  75 cc of Isovue 370. COMPARISON:  Prior radiograph from earlier same day. FINDINGS: Cardiovascular: Intrathoracic aorta of normal caliber and appearance without acute abnormality. Visualized great vessels within normal limits. Heart size normal. No pericardial effusion. Pulmonary arteries adequately opacified for evaluation. Main pulmonary artery measures within normal limits for size at 2.2 cm in diameter. No filling defect to suggest acute pulmonary embolism. Re-formatted imaging confirms these findings. Mediastinum/Nodes: The visualized thyroid is normal. No pathologically enlarged mediastinal or hilar lymph nodes identified. No axillary adenopathy. Esophagus within normal limits. Lungs/Pleura: Patchy multi focal consolidation present within the posterior aspect of both upper and lower lobes, concerning for multifocal pneumonia. Changes most severe on the  right. Findings may be infectious nature, although possible aspiration could be considered. Associated small right pleural effusion. No pulmonary edema. No pneumothorax. No worrisome pulmonary nodule or mass. Tracheobronchial tree remains patent. Upper Abdomen: Visualized portions of the upper abdomen are unremarkable. Musculoskeletal: No acute osseous abnormality. No worrisome lytic or blastic osseous lesions. Review of the MIP images confirms the above findings. IMPRESSION: 1. No CT evidence for acute pulmonary embolism. 2. Multi focal areas of consolidation involving both the right upper and lower lobes, concerning for multifocal pneumonia. Changes are slightly worse on the right. Findings may be infectious in nature. Possible aspiration pneumonitis could also be considered. 3. Small right pleural effusion. Electronically Signed   By: Rise Mu M.D.   On: 09/18/2016 14:27   Dg Chest Port 1 View  Result Date: 09/19/2016 CLINICAL DATA:  Pneumothorax EXAM: PORTABLE CHEST 1 VIEW COMPARISON:  Yesterday FINDINGS: Lungs remain under aerated. Bilateral airspace disease is worse. Normal heart size. Upper mediastinum is prominent likely due to low volumes and vascular crowding. There is no pneumothorax. IMPRESSION: Worsening bilateral airspace disease. Electronically Signed   By: Jolaine Click M.D.   On: 09/19/2016 07:50       Thank  you for the consultation and for allowing [facility] to assist in the care of your patient. Our recommendations are noted above.  Please contact us if we can be of further service.  CCM Time - 35 mins  Stephanie Acre, MD [facility] Pager [phone] (please enter 7-digits) On Call Pager - [phone] (please enter 7-digits)   09/19/2016

## 2016-09-19 NOTE — Progress Notes (Signed)
Pharmacy Antibiotic Note  Joshua [name] is a 20 y.o. male admitted on 09/17/2016 with cholecystitis and Klebsiella bacteremia. Patient admitted to ICU for ARDS. Initially on Zosyn but will escalate to meropenem to cover for possible ESBL Klebsiella pneumoniae due to patient's deteriorating clinical condition. Azithromycin added for atypical coverage for PNA.  Plan: Continue Meropenem 1 g iv q 8 hours.   Height: 5' (152.4 cm) Weight: 212 lb 8.4 oz (96.4 kg) IBW/kg (Calculated) : 50  Temp (24hrs), Avg:99.5 F (37.5 C), Min:98.7 F (37.1 C), Max:100.7 F (38.2 C)   Recent Labs Lab 09/17/16 0918 09/17/16 0943 09/18/16 0601 09/19/16 0433  WBC 21.9*  --  21.5* 15.2*  CREATININE 1.02  --  1.10 1.00  LATICACIDVEN  --  1.4  --   --     Estimated Creatinine Clearance: 113.4 mL/min (by C-G formula based on SCr of 1 mg/dL).    No Known Allergies  Antimicrobials this admission: Zosyn 10/8 >> 10/9 Meropenem 10/9 >>  Azithromycin 10/9 >>   Dose adjustments this admission:   Microbiology results: 10/8 BCx: GNR with BCID Klebsiella pneumoniae 10/8 MRSA PCR: negative  Thank you for allowing pharmacy to be a part of this patient's care.  Luisa HartChristy, Denyce Harr D 09/19/2016 3:45 PM

## 2016-09-19 NOTE — Progress Notes (Signed)
[facility] [place] [facility]   PATIENT NAME: Joshua SenterCody [name]    MR#:  [id number]  DATE OF BIRTH:  08/05/1996  SUBJECTIVE:  CHIEF COMPLAINT:   Chief Complaint  Patient presents with  . Abdominal Pain   Still has abd pain and SOB. Unable to take deep breaths. Febrile 6 L O2  REVIEW OF SYSTEMS:    Review of Systems  Constitutional: Positive for malaise/fatigue. Negative for chills and fever.  HENT: Negative for sore throat.   Eyes: Negative for blurred vision, double vision and pain.  Respiratory: Positive for cough and shortness of breath. Negative for hemoptysis and wheezing.   Cardiovascular: Negative for chest pain, palpitations, orthopnea and leg swelling.  Gastrointestinal: Positive for abdominal pain and nausea. Negative for constipation, diarrhea, heartburn and vomiting.  Genitourinary: Negative for dysuria and hematuria.  Musculoskeletal: Negative for back pain and joint pain.  Skin: Negative for rash.  Neurological: Positive for weakness. Negative for sensory change, speech change, focal weakness and headaches.  Endo/Heme/Allergies: Does not bruise/bleed easily.  Psychiatric/Behavioral: Negative for depression. The patient is not nervous/anxious.     DRUG ALLERGIES:  No Known Allergies  VITALS:  Blood pressure 137/85, pulse (!) 129, temperature 99.7 F (37.6 C), resp. rate (!) 27, height 5' (1.524 m), weight 96.4 kg (212 lb 8.4 oz), SpO2 92 %.  PHYSICAL EXAMINATION:   Physical Exam  GENERAL:  20 y.o.-year-old patient lying in the bed and looks critically ill EYES: Pupils equal, round, reactive to light and accommodation. No scleral icterus. Extraocular muscles intact.  HEENT: Head atraumatic, normocephalic. Oropharynx and nasopharynx clear.  NECK:  Supple, no jugular venous distention. No thyroid enlargement, no tenderness.  LUNGS: bilateral ronchi with decreased breath sounds. Shallow breathing CARDIOVASCULAR: S1, S2 normal. No  murmurs, rubs, or gallops.  ABDOMEN: Soft, nondistended. Bowel sounds present. No organomegaly or mass. Tender. EXTREMITIES: No cyanosis, clubbing or edema b/l.     PSYCHIATRIC: The patient is alert and oriented x 3.  SKIN: No obvious rash, lesion, or ulcer.   LABORATORY PANEL:   CBC  Recent Labs Lab 09/19/16 0433  WBC 15.2*  HGB 14.4  HCT 40.8  PLT 185   ------------------------------------------------------------------------------------------------------------------ Chemistries   Recent Labs Lab 09/19/16 0433  NA 133*  K 3.7  CL 100*  CO2 26  GLUCOSE 94  BUN 12  CREATININE 1.00  CALCIUM 8.4*  AST 17  ALT 20  ALKPHOS 63  BILITOT 1.2   ------------------------------------------------------------------------------------------------------------------  Cardiac Enzymes  Recent Labs Lab 09/17/16 0918  TROPONINI <0.03   ------------------------------------------------------------------------------------------------------------------  RADIOLOGY:  Dg Chest 1 View  Result Date: 09/18/2016 CLINICAL DATA:  Shortness of breath and right upper abdominal pain. EXAM: CHEST 1 VIEW COMPARISON:  09/17/2016 FINDINGS: Lung volumes are lower compared to the prior study with atelectasis present bilaterally, especially in the right perihilar lung. No overt edema or pleural effusion. The heart size and mediastinal contours are normal. IMPRESSION: Lower lung volumes with more prominent atelectasis bilaterally, most prominently in the right perihilar region. Electronically Signed   By: Irish LackGlenn  Yamagata M.D.   On: 09/18/2016 12:13   Ct Angio Chest Pe W Or Wo Contrast  Result Date: 09/18/2016 CLINICAL DATA:  Initial evaluation for acute dyspnea. EXAM: CT ANGIOGRAPHY CHEST WITH CONTRAST TECHNIQUE: Multidetector CT imaging of the chest was performed using the standard protocol during bolus administration of intravenous contrast. Multiplanar CT image reconstructions and MIPs were obtained to  evaluate the vascular anatomy. CONTRAST:  75 cc of  Isovue 370. COMPARISON:  Prior radiograph from earlier same day. FINDINGS: Cardiovascular: Intrathoracic aorta of normal caliber and appearance without acute abnormality. Visualized great vessels within normal limits. Heart size normal. No pericardial effusion. Pulmonary arteries adequately opacified for evaluation. Main pulmonary artery measures within normal limits for size at 2.2 cm in diameter. No filling defect to suggest acute pulmonary embolism. Re-formatted imaging confirms these findings. Mediastinum/Nodes: The visualized thyroid is normal. No pathologically enlarged mediastinal or hilar lymph nodes identified. No axillary adenopathy. Esophagus within normal limits. Lungs/Pleura: Patchy multi focal consolidation present within the posterior aspect of both upper and lower lobes, concerning for multifocal pneumonia. Changes most severe on the right. Findings may be infectious nature, although possible aspiration could be considered. Associated small right pleural effusion. No pulmonary edema. No pneumothorax. No worrisome pulmonary nodule or mass. Tracheobronchial tree remains patent. Upper Abdomen: Visualized portions of the upper abdomen are unremarkable. Musculoskeletal: No acute osseous abnormality. No worrisome lytic or blastic osseous lesions. Review of the MIP images confirms the above findings. IMPRESSION: 1. No CT evidence for acute pulmonary embolism. 2. Multi focal areas of consolidation involving both the right upper and lower lobes, concerning for multifocal pneumonia. Changes are slightly worse on the right. Findings may be infectious in nature. Possible aspiration pneumonitis could also be considered. 3. Small right pleural effusion. Electronically Signed   By: Rise Mu M.D.   On: 09/18/2016 14:27   Ct Abdomen Pelvis W Contrast  Result Date: 09/17/2016 CLINICAL DATA:  Pt presents to ED with reports of RUQ abdominal pain. Pt  denies NVD. Pt reports he had a small bowel movement yesterday but thinks he might be constipated. Pt reports decreased appetite EXAM: CT ABDOMEN AND PELVIS WITH CONTRAST TECHNIQUE: Multidetector CT imaging of the abdomen and pelvis was performed using the standard protocol following bolus administration of intravenous contrast. CONTRAST:  ISOVUE-300 IOPAMIDOL (ISOVUE-300) INJECTION 61% COMPARISON:  Right upper quadrant ultrasound, 09/17/2016 FINDINGS: Lower chest: No acute abnormality. Hepatobiliary: Despite the unremarkable appearance of the gallbladder on the current ultrasound, on CT, the gallbladder is distended. Wall is prominent but not overtly thickened. There is pericholecystic inflammation. A stone projects in the gallbladder neck. The liver is unremarkable. There is no bile duct dilation. Pancreas: Unremarkable. No pancreatic ductal dilatation or surrounding inflammatory changes. Spleen: Normal in size without focal abnormality. Adrenals/Urinary Tract: Adrenal glands are unremarkable. Kidneys are normal, without renal calculi, focal lesion, or hydronephrosis. Bladder is unremarkable. Stomach/Bowel: Stomach is within normal limits. Appendix appears normal. No evidence of bowel wall thickening, distention, or inflammatory changes. Vascular/Lymphatic: No significant vascular findings are present. No enlarged abdominal or pelvic lymph nodes. Reproductive: Prostate is unremarkable. Other: No abdominal wall hernia or abnormality. No abdominopelvic ascites. Musculoskeletal: Unremarkable. IMPRESSION: 1. Gallbladder is distended with pericholecystic inflammation as well as a gallstone that projects in the gallbladder neck. Despite the normal appearance of the gallbladder on the current ultrasound, the findings strongly suggest acute cholecystitis. No bile duct stone or biliary dilation. 2. No other acute abnormality within the abdomen pelvis. Exam is otherwise unremarkable. Electronically Signed   By: Amie Portland M.D.   On: 09/17/2016 11:59   Dg Chest Port 1 View  Result Date: 09/19/2016 CLINICAL DATA:  Pneumothorax EXAM: PORTABLE CHEST 1 VIEW COMPARISON:  Yesterday FINDINGS: Lungs remain under aerated. Bilateral airspace disease is worse. Normal heart size. Upper mediastinum is prominent likely due to low volumes and vascular crowding. There is no pneumothorax. IMPRESSION: Worsening bilateral airspace disease. Electronically Signed  By: Jolaine Click M.D.   On: 09/19/2016 07:50     ASSESSMENT AND PLAN:   Patient is a 20 year old admitted with acute cholecystitis now acute respiratory failure  * Multifocal pneumonia likely aspirational with Acute hypoxic respiratory failure and sepsis On IV antibiotics. Next when necessary. Wean oxygen as tolerated. Acutely ill.  * Acute cholecystitis On IV antibiotics. Further management as per surgery. Not a candidate for cholecystectomy at this time.  * DVT prophylaxis: lovenox  All the records are reviewed and case discussed with Care Management/Social Workerr. Management plans discussed with the patient, family and they are in agreement.  CODE STATUS: FULL CODE  DVT Prophylaxis: SCDs  TOTAL CC TIME TAKING CARE OF THIS PATIENT: 35 minutes.   Milagros Loll R M.D on 09/19/2016 at 11:50 AM  Between 7am to 6pm - Pager - [phone]  After 6pm go to [email or web address] - password [email or web address] [facility]  [facility]  Office  [phone]  CC: Primary care physician; No PCP Per Patient  Note: This dictation was prepared with Dragon dictation along with smaller phrase technology. Any transcriptional errors that result from this process are unintentional.

## 2016-09-19 NOTE — Progress Notes (Signed)
Chaplain met Pt's family in the family waiting room and talked to them for awhile. Pt's family asked Ch if he can visit with Pt and offer prayers for him when he returns from a procedure, Ch agreed to do so. In the meantime, Ch offered prayers for Pt who was still undergoing a procedure and for his family to cope as they waited for him.     09/19/16 2000  Clinical Encounter Type  Visited With Family  Visit Type Initial  Referral From Family  Spiritual Encounters  Spiritual Needs Prayer;Other (Comment)

## 2016-09-19 NOTE — Progress Notes (Signed)
Patient just sent to nuclear med for scan. Heart rate was in the 115 range and now 130's again. Dr Sung AmabileSimonds aware. Spoke with Coralee RudDudley in nuclear med and states that patients mom can come down with him for reassurance.

## 2016-09-19 NOTE — Progress Notes (Signed)
Patient ID: Joshua [name], male   DOB: November 05, 1996, 20 y.o.   MRN: [id number] Surgery follow up. Pt reportedly developed RLL pneumonia and was transferred to IC yesterday. He also has Klebsiella in blood.  He states his breathing is a little better. Still has ruq abd pain. T max-99.7, tachycardic, bp slightly elevated. Lungs- decreased sound right mid/lower lobes. Abdomen, tender in ruq. Active bowel sounds. WBC 15K.LFTs normal. CT showed some signs of inflammation around gallbladder, US -no stones.  Pt obviously is not a surgical candidate at present. Given pos blood culture, may want to consider a HIDA scan to confirm acute cholecystitis. If confirmed, may consider  Percutaneous cholecystostomy. Discussed with pt, his mother and  Dr. Dema SeverinMungal.

## 2016-09-19 NOTE — Progress Notes (Signed)
Pt in nuclear medicine for scan.  Dr. Ova FreshwaterLiebkemann with radiology ordered 3 mg morphine once for test.  Pt mother has concerns regarding pt receiving morphine, as pt mother states that when pt in ED and received morphine, "his HR increased and his breathing rate and oxygen dropped."  Rn called and spoke with Dr. Ova FreshwaterLiebkemann regarding mothers concern.  Per Ova FreshwaterLiebkemann, pt needs to receive morphine in order for scan to proceed, if mother still has concerns then will have to discontinue scan.  Rn went and spoke with pt mother regarding Dr. Javier GlazierLiebkemann's recommendation.  Per pt mother, she would like to proceed with scan if possible.  Spoke with Bennett ScrapeBincy Varughese, NP regarding situation.  Per Bincy, may give narcan if needed if pt has suppressed respirations.  Pt mother in agreement with giving morphine if narcan available as antidote for adverse reactions.

## 2016-09-20 ENCOUNTER — Inpatient Hospital Stay: Payer: Medicaid Other

## 2016-09-20 ENCOUNTER — Encounter: Payer: Self-pay | Admitting: Interventional Radiology

## 2016-09-20 DIAGNOSIS — J181 Lobar pneumonia, unspecified organism: Secondary | ICD-10-CM

## 2016-09-20 HISTORY — PX: IR GENERIC HISTORICAL: IMG1180011

## 2016-09-20 LAB — COMPREHENSIVE METABOLIC PANEL
ALBUMIN: 3 g/dL — AB (ref 3.5–5.0)
ALK PHOS: 62 U/L (ref 38–126)
ALT: 31 U/L (ref 17–63)
ANION GAP: 9 (ref 5–15)
AST: 35 U/L (ref 15–41)
BUN: 11 mg/dL (ref 6–20)
CALCIUM: 8.1 mg/dL — AB (ref 8.9–10.3)
CHLORIDE: 98 mmol/L — AB (ref 101–111)
CO2: 23 mmol/L (ref 22–32)
CREATININE: 0.97 mg/dL (ref 0.61–1.24)
GFR calc non Af Amer: >60 mL/min (ref >60)
GLUCOSE: 113 mg/dL — AB (ref 65–99)
Potassium: 3.8 mmol/L (ref 3.5–5.1)
SODIUM: 130 mmol/L — AB (ref 135–145)
Total Bilirubin: 1.3 mg/dL — ABNORMAL HIGH (ref 0.3–1.2)
Total Protein: 6.2 g/dL — ABNORMAL LOW (ref 6.5–8.1)

## 2016-09-20 LAB — BLOOD GAS, ARTERIAL
ACID-BASE DEFICIT: 0 mmol/L (ref 0.0–2.0)
BICARBONATE: 26 mmol/L (ref 20.0–28.0)
FIO2: 0.44
O2 Saturation: 83.3 %
PCO2 ART: 46 mmHg (ref 32.0–48.0)
PH ART: 7.36 (ref 7.350–7.450)
PO2 ART: 50 mmHg — AB (ref 83.0–108.0)
Patient temperature: 37

## 2016-09-20 LAB — CBC WITH DIFFERENTIAL/PLATELET
BASOS PCT: 0 %
Basophils Absolute: 0 10*3/uL (ref 0–0.1)
EOS ABS: 0 10*3/uL (ref 0–0.7)
EOS PCT: 0 %
HCT: 41.7 % (ref 40.0–52.0)
HEMOGLOBIN: 14.8 g/dL (ref 13.0–18.0)
Lymphocytes Relative: 5 %
Lymphs Abs: 0.4 10*3/uL — ABNORMAL LOW (ref 1.0–3.6)
MCH: 30.2 pg (ref 26.0–34.0)
MCHC: 35.4 g/dL (ref 32.0–36.0)
MCV: 85.3 fL (ref 80.0–100.0)
Monocytes Absolute: 0.6 10*3/uL (ref 0.2–1.0)
Monocytes Relative: 8 %
NEUTROS PCT: 87 %
Neutro Abs: 6.8 10*3/uL — ABNORMAL HIGH (ref 1.4–6.5)
PLATELETS: 193 10*3/uL (ref 150–440)
RBC: 4.89 MIL/uL (ref 4.40–5.90)
RDW: 12.5 % (ref 11.5–14.5)
WBC: 7.9 10*3/uL (ref 3.8–10.6)

## 2016-09-20 LAB — CULTURE, BLOOD (ROUTINE X 2)

## 2016-09-20 LAB — LACTIC ACID, PLASMA
Lactic Acid, Venous: 1 mmol/L (ref 0.5–1.9)
Lactic Acid, Venous: 0.8 mmol/L (ref 0.5–1.9)

## 2016-09-20 MED ORDER — FENTANYL CITRATE (PF) 100 MCG/2ML IJ SOLN
INTRAMUSCULAR | Status: AC | PRN
  Administered 2016-09-20: 50 ug via INTRAVENOUS
  Administered 2016-09-20: 25 ug via INTRAVENOUS

## 2016-09-20 MED ORDER — MIDAZOLAM HCL 5 MG/5ML IJ SOLN
INTRAMUSCULAR | Status: AC
  Filled 2016-09-20: qty 10

## 2016-09-20 MED ORDER — ACETAMINOPHEN 500 MG PO TABS
ORAL_TABLET | ORAL | Status: AC
  Filled 2016-09-20: qty 2

## 2016-09-20 MED ORDER — ACETAMINOPHEN 10 MG/ML IV SOLN
1000.0000 mg | Freq: Four times a day (QID) | INTRAVENOUS | Status: DC
  Administered 2016-09-20: 1000 mg via INTRAVENOUS
  Filled 2016-09-20 (×5): qty 100

## 2016-09-20 MED ORDER — IBUPROFEN 600 MG PO TABS
600.0000 mg | ORAL_TABLET | Freq: Once | ORAL | Status: AC
  Administered 2016-09-20: 600 mg via ORAL
  Filled 2016-09-20: qty 1

## 2016-09-20 MED ORDER — LIDOCAINE-EPINEPHRINE 1 %-1:100000 IJ SOLN
INTRAMUSCULAR | Status: DC | PRN
  Administered 2016-09-20: 10 mL

## 2016-09-20 MED ORDER — IOPAMIDOL (ISOVUE-300) INJECTION 61%
30.0000 mL | Freq: Once | INTRAVENOUS | Status: AC | PRN
  Administered 2016-09-20: 10 mL via INTRAVENOUS

## 2016-09-20 MED ORDER — HEPARIN SODIUM (PORCINE) 5000 UNIT/ML IJ SOLN
5000.0000 [IU] | Freq: Three times a day (TID) | INTRAMUSCULAR | Status: DC
  Administered 2016-09-20 – 2016-09-22 (×5): 5000 [IU] via SUBCUTANEOUS
  Filled 2016-09-20 (×6): qty 1

## 2016-09-20 MED ORDER — FUROSEMIDE 10 MG/ML IJ SOLN
INTRAMUSCULAR | Status: AC
  Filled 2016-09-20: qty 2

## 2016-09-20 MED ORDER — FUROSEMIDE 10 MG/ML IJ SOLN
20.0000 mg | Freq: Once | INTRAMUSCULAR | Status: AC
  Administered 2016-09-20: 20 mg via INTRAVENOUS

## 2016-09-20 MED ORDER — MIDAZOLAM HCL 5 MG/5ML IJ SOLN
INTRAMUSCULAR | Status: AC | PRN
  Administered 2016-09-20: 0.5 mg via INTRAVENOUS
  Administered 2016-09-20: 2 mg via INTRAVENOUS

## 2016-09-20 MED ORDER — FENTANYL CITRATE (PF) 100 MCG/2ML IJ SOLN
INTRAMUSCULAR | Status: AC
  Filled 2016-09-20: qty 4

## 2016-09-20 NOTE — Progress Notes (Signed)
Patient back from specials, with biliary drain in place, draining pink thick drainage, c/o significant pain on insertion site to right shoulder, pain med given. VS wnl as charted.

## 2016-09-20 NOTE — Consult Note (Signed)
Chief Complaint: Acute cholecystitis  Referring Physician(s): Shankar  Patient Status: Inpatient - [facility]  History of Present Illness: Joshua [name] is a 20 y.o. male with past history significant for autism who was admitted to [facility] and 09/17/2016 with the complaint of epigastric abdominal pain radiating to his back. Subsequently CT scan of the abdomen and pelvis performed 09/17/2016 demonstrated findings worrisome for acute Cholecystitis. Nuclear Medicine HIDA scan performed 09/19/2016 demonstrated nonfilling of the gallbladder compatible with cystic duct obstruction.  Unfortunately, in the interval the patient has developed Klebsiella pneumonia with associated shortness of breath requiring transfer to the ICU.  The patient has been evaluated by the surgical team but has been deemed a non-operative candidate at this time given concomitant pneumonia and as such, request made for placement of the cholecystostomy tube for infection source control.  Patient complains of shortness of breath and mild right upper quadrant abdominal pain (though note, history is limited due to patient's autism).  The patient is accompanied by his mother and on. His mother serves as his power of attorney.  Past Medical History:  Diagnosis Date  . Autism   . Irritable bowel syndrome     Past Surgical History:  Procedure Laterality Date  . EYE SURGERY      Allergies: Review of patient's allergies indicates no known allergies.  Medications: Prior to Admission medications   Not on File     History reviewed. No pertinent family history.  Social History   Social History  . Marital status: Single    Spouse name: N/A  . Number of children: N/A  . Years of education: N/A   Social History Main Topics  . Smoking status: Never Smoker  . Smokeless tobacco: Never Used  . Alcohol use None  . Drug use: Unknown  . Sexual activity: Not Asked   Other Topics Concern  . None    Social History Narrative  . None    ECOG Status: 2 - Symptomatic, <50% confined to bed  Review of Systems: A 12 point ROS discussed and pertinent positives are indicated in the HPI above.  All other systems are negative.  Review of Systems  Constitutional: Positive for activity change and fever.  Respiratory: Positive for cough and shortness of breath.   Cardiovascular: Negative.   Gastrointestinal: Positive for abdominal pain.    Vital Signs: BP (!) 154/81   Pulse (!) 125   Temp 99.6 F (37.6 C) (Oral)   Resp (!) 40   Ht 5' (1.524 m)   Wt 213 lb 6.5 oz (96.8 kg)   SpO2 (!) 89%   BMI 41.68 kg/m   Physical Exam  Constitutional: He appears well-developed and well-nourished.  HENT:  Head: Normocephalic and atraumatic.  Cardiovascular:  Tachycardic. Regular rhythm.  Abdominal:  Mild tenderness with palpation of the right upper abdominal quadrant.    Imaging: Dg Chest 1 View  Result Date: 09/18/2016 CLINICAL DATA:  Shortness of breath and right upper abdominal pain. EXAM: CHEST 1 VIEW COMPARISON:  09/17/2016 FINDINGS: Lung volumes are lower compared to the prior study with atelectasis present bilaterally, especially in the right perihilar lung. No overt edema or pleural effusion. The heart size and mediastinal contours are normal. IMPRESSION: Lower lung volumes with more prominent atelectasis bilaterally, most prominently in the right perihilar region. Electronically Signed   By: Irish Lack M.D.   On: 09/18/2016 12:13   Dg Chest 2 View  Result Date: 09/17/2016 CLINICAL DATA:  Pt has a  fever and is having RUQ pain for a couple of days now. Nonsmoker. No heart or lung problems. EXAM: CHEST  2 VIEW COMPARISON:  None. FINDINGS: The heart size and mediastinal contours are within normal limits. Both lungs are clear. No pleural effusion or pneumothorax. The visualized skeletal structures are unremarkable. IMPRESSION: No active cardiopulmonary disease. Electronically Signed    By: Amie Portland M.D.   On: 09/17/2016 11:53   Ct Angio Chest Pe W Or Wo Contrast  Result Date: 09/18/2016 CLINICAL DATA:  Initial evaluation for acute dyspnea. EXAM: CT ANGIOGRAPHY CHEST WITH CONTRAST TECHNIQUE: Multidetector CT imaging of the chest was performed using the standard protocol during bolus administration of intravenous contrast. Multiplanar CT image reconstructions and MIPs were obtained to evaluate the vascular anatomy. CONTRAST:  75 cc of Isovue 370. COMPARISON:  Prior radiograph from earlier same day. FINDINGS: Cardiovascular: Intrathoracic aorta of normal caliber and appearance without acute abnormality. Visualized great vessels within normal limits. Heart size normal. No pericardial effusion. Pulmonary arteries adequately opacified for evaluation. Main pulmonary artery measures within normal limits for size at 2.2 cm in diameter. No filling defect to suggest acute pulmonary embolism. Re-formatted imaging confirms these findings. Mediastinum/Nodes: The visualized thyroid is normal. No pathologically enlarged mediastinal or hilar lymph nodes identified. No axillary adenopathy. Esophagus within normal limits. Lungs/Pleura: Patchy multi focal consolidation present within the posterior aspect of both upper and lower lobes, concerning for multifocal pneumonia. Changes most severe on the right. Findings may be infectious nature, although possible aspiration could be considered. Associated small right pleural effusion. No pulmonary edema. No pneumothorax. No worrisome pulmonary nodule or mass. Tracheobronchial tree remains patent. Upper Abdomen: Visualized portions of the upper abdomen are unremarkable. Musculoskeletal: No acute osseous abnormality. No worrisome lytic or blastic osseous lesions. Review of the MIP images confirms the above findings. IMPRESSION: 1. No CT evidence for acute pulmonary embolism. 2. Multi focal areas of consolidation involving both the right upper and lower lobes,  concerning for multifocal pneumonia. Changes are slightly worse on the right. Findings may be infectious in nature. Possible aspiration pneumonitis could also be considered. 3. Small right pleural effusion. Electronically Signed   By: Rise Mu M.D.   On: 09/18/2016 14:27   Nm Hepatobiliary Liver Func  Result Date: 09/19/2016 CLINICAL DATA:  Acute cholecystitis. Right upper quadrant pain for 3 days. EXAM: NUCLEAR MEDICINE HEPATOBILIARY IMAGING TECHNIQUE: Sequential images of the abdomen were obtained out to 60 minutes following intravenous administration of radiopharmaceutical. RADIOPHARMACEUTICALS:  4.938 mCi Tc-20m Choletec IV. After 2 hours, 3 mg of morphine was injected and an additional 2.152 mCi Tc-25m Choletec injected IV. COMPARISON:  Abdominal CT 07/20/2016 FINDINGS: Prompt uptake and biliary excretion of activity by the liver is seen. Gallbladder activity is not visualized over the course of 2 hours. There is biliary activity and activity passes into the small bowel. 3 mg morphine administered IV followed by additional Choletec injection. No gallbladder was visualized after an additional 30 minutes. IMPRESSION: No gallbladder visualization consistent with acute cholecystitis, even after the administration of IV morphine. Electronically Signed   By: Rubye Oaks M.D.   On: 09/19/2016 21:49   Ct Abdomen Pelvis W Contrast  Result Date: 09/17/2016 CLINICAL DATA:  Pt presents to ED with reports of RUQ abdominal pain. Pt denies NVD. Pt reports he had a small bowel movement yesterday but thinks he might be constipated. Pt reports decreased appetite EXAM: CT ABDOMEN AND PELVIS WITH CONTRAST TECHNIQUE: Multidetector CT imaging of the abdomen and pelvis  was performed using the standard protocol following bolus administration of intravenous contrast. CONTRAST:  ISOVUE-300 IOPAMIDOL (ISOVUE-300) INJECTION 61% COMPARISON:  Right upper quadrant ultrasound, 09/17/2016 FINDINGS: Lower chest:  No acute abnormality. Hepatobiliary: Despite the unremarkable appearance of the gallbladder on the current ultrasound, on CT, the gallbladder is distended. Wall is prominent but not overtly thickened. There is pericholecystic inflammation. A stone projects in the gallbladder neck. The liver is unremarkable. There is no bile duct dilation. Pancreas: Unremarkable. No pancreatic ductal dilatation or surrounding inflammatory changes. Spleen: Normal in size without focal abnormality. Adrenals/Urinary Tract: Adrenal glands are unremarkable. Kidneys are normal, without renal calculi, focal lesion, or hydronephrosis. Bladder is unremarkable. Stomach/Bowel: Stomach is within normal limits. Appendix appears normal. No evidence of bowel wall thickening, distention, or inflammatory changes. Vascular/Lymphatic: No significant vascular findings are present. No enlarged abdominal or pelvic lymph nodes. Reproductive: Prostate is unremarkable. Other: No abdominal wall hernia or abnormality. No abdominopelvic ascites. Musculoskeletal: Unremarkable. IMPRESSION: 1. Gallbladder is distended with pericholecystic inflammation as well as a gallstone that projects in the gallbladder neck. Despite the normal appearance of the gallbladder on the current ultrasound, the findings strongly suggest acute cholecystitis. No bile duct stone or biliary dilation. 2. No other acute abnormality within the abdomen pelvis. Exam is otherwise unremarkable. Electronically Signed   By: Amie Portland M.D.   On: 09/17/2016 11:59   Dg Chest Port 1 View  Result Date: 09/19/2016 CLINICAL DATA:  Pneumothorax EXAM: PORTABLE CHEST 1 VIEW COMPARISON:  Yesterday FINDINGS: Lungs remain under aerated. Bilateral airspace disease is worse. Normal heart size. Upper mediastinum is prominent likely due to low volumes and vascular crowding. There is no pneumothorax. IMPRESSION: Worsening bilateral airspace disease. Electronically Signed   By: Jolaine Click M.D.   On:  09/19/2016 07:50   US Abdomen Limited Ruq  Result Date: 09/17/2016 CLINICAL DATA:  One day history of right upper quadrant pain EXAM: US ABDOMEN LIMITED - RIGHT UPPER QUADRANT COMPARISON:  None. FINDINGS: Gallbladder: No gallstones or wall thickening visualized. There is no pericholecystic fluid. No sonographic Murphy sign noted by sonographer. Common bile duct: Diameter: 4 mm. No intrahepatic or extrahepatic biliary duct dilatation. Liver: No focal lesion identified. Within normal limits in parenchymal echogenicity. IMPRESSION: Study within normal limits. Electronically Signed   By: Bretta Bang III M.D.   On: 09/17/2016 10:36    Labs:  CBC:  Recent Labs  09/17/16 0918 09/18/16 0601 09/19/16 0433  WBC 21.9* 21.5* 15.2*  HGB 16.9 14.9 14.4  HCT 47.8 42.1 40.8  PLT 259 193 185    COAGS:  Recent Labs  09/17/16 0918  INR 1.07    BMP:  Recent Labs  09/17/16 0918 09/18/16 0601 09/19/16 0433  NA 132* 135 133*  K 3.7 3.9 3.7  CL 98* 101 100*  CO2 25 28 26   GLUCOSE 123* 94 94  BUN 13 12 12   CALCIUM 9.3 8.5* 8.4*  CREATININE 1.02 1.10 1.00  GFRNONAA >60 >60 >60  GFRAA >60 >60 >60    LIVER FUNCTION TESTS:  Recent Labs  09/17/16 0918 09/18/16 0601 09/19/16 0433  BILITOT 1.2 1.5* 1.2  AST 24 16 17   ALT 34 23 20  ALKPHOS 93 70 63  PROT 7.6 6.0* 6.0*  ALBUMIN 4.4 3.3* 2.9*    Assessment and Plan:  Joshua [name] is a 20 y.o. male with past history significant for autism who was admitted to [facility] and 09/17/2016 with the complaint of epigastric abdominal pain radiating  to his back found to have acute cholecystitis, located by development of Klebsiella pneumonia.  The patient has been evaluated by the surgical team but has been deemed a non-operative candidate at this time given concomitant pneumonia and as such, request made for placement of the cholecystostomy tube for infection source control.  Risks and Benefits of ultrasound for scopic  guided cholecystostomy tube placement were discussed with the patient and the patient's family including, but not limited to bleeding, infection, gallbladder perforation, bile leak, sepsis or even death.  I explained that the chole tube would stay in place for a minimum of 6 weeks (to allow for tract maturation) if interval cholecystectomy is not performed.  All of the patient's questions were answered, patient is agreeable to proceed.  Consent signed and in chart.  Thank you for this interesting consult.  I greatly enjoyed meeting Joshua [name] and look forward to participating in their care.  A copy of this report was sent to the requesting provider on this date.  Electronically Signed: Simonne ComeWATTS, Nicey Krah 09/20/2016, 9:25 AM   I spent a total of 20 Minutes in face to face in clinical consultation, greater than 50% of which was counseling/coordinating care for acute cholecystitis

## 2016-09-20 NOTE — Progress Notes (Signed)
CC: Abdominal pain and difficulty breathing Subjective: Patient resting in bed. States he still having some abdominal pain but it will be better this morning. Continues to have difficulty breathing.  Objective: Vital signs in last 24 hours: Temp:  [98.7 F (37.1 C)-103.3 F (39.6 C)] 100.2 F (37.9 C) (10/11 0700) Pulse Rate:  [104-148] 124 (10/11 0600) Resp:  [24-46] 32 (10/11 0600) BP: (118-167)/(60-96) 154/81 (10/11 0600) SpO2:  [88 %-99 %] 95 % (10/11 0600) Weight:  [96.8 kg (213 lb 6.5 oz)] 96.8 kg (213 lb 6.5 oz) (10/11 0500)    Intake/Output from previous day: 10/10 0701 - 10/11 0700 In: 675 [I.V.:675] Out: 1300 [Urine:1300] Intake/Output this shift: No intake/output data recorded.  Physical exam:  General: Resting in bed in no obvious distress Chest: Coarse breath sounds bilaterally but worse on the right. Taking shallow breaths. Heart: Tachycardic Abdomen: Soft, tender to palpation in the right upper quadrant, nondistended.  Lab Results: CBC   Recent Labs  09/18/16 0601 09/19/16 0433  WBC 21.5* 15.2*  HGB 14.9 14.4  HCT 42.1 40.8  PLT 193 185   BMET  Recent Labs  09/18/16 0601 09/19/16 0433  NA 135 133*  K 3.9 3.7  CL 101 100*  CO2 28 26  GLUCOSE 94 94  BUN 12 12  CREATININE 1.10 1.00  CALCIUM 8.5* 8.4*   PT/INR  Recent Labs  09/17/16 0918  LABPROT 13.9  INR 1.07   ABG  Recent Labs  09/18/16 1123  PHART 7.36  HCO3 26.0    Studies/Results: Dg Chest 1 View  Result Date: 09/18/2016 CLINICAL DATA:  Shortness of breath and right upper abdominal pain. EXAM: CHEST 1 VIEW COMPARISON:  09/17/2016 FINDINGS: Lung volumes are lower compared to the prior study with atelectasis present bilaterally, especially in the right perihilar lung. No overt edema or pleural effusion. The heart size and mediastinal contours are normal. IMPRESSION: Lower lung volumes with more prominent atelectasis bilaterally, most prominently in the right perihilar  region. Electronically Signed   By: Irish LackGlenn  Yamagata M.D.   On: 09/18/2016 12:13   Ct Angio Chest Pe W Or Wo Contrast  Result Date: 09/18/2016 CLINICAL DATA:  Initial evaluation for acute dyspnea. EXAM: CT ANGIOGRAPHY CHEST WITH CONTRAST TECHNIQUE: Multidetector CT imaging of the chest was performed using the standard protocol during bolus administration of intravenous contrast. Multiplanar CT image reconstructions and MIPs were obtained to evaluate the vascular anatomy. CONTRAST:  75 cc of Isovue 370. COMPARISON:  Prior radiograph from earlier same day. FINDINGS: Cardiovascular: Intrathoracic aorta of normal caliber and appearance without acute abnormality. Visualized great vessels within normal limits. Heart size normal. No pericardial effusion. Pulmonary arteries adequately opacified for evaluation. Main pulmonary artery measures within normal limits for size at 2.2 cm in diameter. No filling defect to suggest acute pulmonary embolism. Re-formatted imaging confirms these findings. Mediastinum/Nodes: The visualized thyroid is normal. No pathologically enlarged mediastinal or hilar lymph nodes identified. No axillary adenopathy. Esophagus within normal limits. Lungs/Pleura: Patchy multi focal consolidation present within the posterior aspect of both upper and lower lobes, concerning for multifocal pneumonia. Changes most severe on the right. Findings may be infectious nature, although possible aspiration could be considered. Associated small right pleural effusion. No pulmonary edema. No pneumothorax. No worrisome pulmonary nodule or mass. Tracheobronchial tree remains patent. Upper Abdomen: Visualized portions of the upper abdomen are unremarkable. Musculoskeletal: No acute osseous abnormality. No worrisome lytic or blastic osseous lesions. Review of the MIP images confirms the above findings. IMPRESSION: 1.  No CT evidence for acute pulmonary embolism. 2. Multi focal areas of consolidation involving both the  right upper and lower lobes, concerning for multifocal pneumonia. Changes are slightly worse on the right. Findings may be infectious in nature. Possible aspiration pneumonitis could also be considered. 3. Small right pleural effusion. Electronically Signed   By: Rise Mu M.D.   On: 09/18/2016 14:27   Nm Hepatobiliary Liver Func  Result Date: 09/19/2016 CLINICAL DATA:  Acute cholecystitis. Right upper quadrant pain for 3 days. EXAM: NUCLEAR MEDICINE HEPATOBILIARY IMAGING TECHNIQUE: Sequential images of the abdomen were obtained out to 60 minutes following intravenous administration of radiopharmaceutical. RADIOPHARMACEUTICALS:  4.938 mCi Tc-49m Choletec IV. After 2 hours, 3 mg of morphine was injected and an additional 2.152 mCi Tc-10m Choletec injected IV. COMPARISON:  Abdominal CT 07/20/2016 FINDINGS: Prompt uptake and biliary excretion of activity by the liver is seen. Gallbladder activity is not visualized over the course of 2 hours. There is biliary activity and activity passes into the small bowel. 3 mg morphine administered IV followed by additional Choletec injection. No gallbladder was visualized after an additional 30 minutes. IMPRESSION: No gallbladder visualization consistent with acute cholecystitis, even after the administration of IV morphine. Electronically Signed   By: Rubye Oaks M.D.   On: 09/19/2016 21:49   Dg Chest Port 1 View  Result Date: 09/19/2016 CLINICAL DATA:  Pneumothorax EXAM: PORTABLE CHEST 1 VIEW COMPARISON:  Yesterday FINDINGS: Lungs remain under aerated. Bilateral airspace disease is worse. Normal heart size. Upper mediastinum is prominent likely due to low volumes and vascular crowding. There is no pneumothorax. IMPRESSION: Worsening bilateral airspace disease. Electronically Signed   By: Jolaine Click M.D.   On: 09/19/2016 07:50    Anti-infectives: Anti-infectives    Start     Dose/Rate Route Frequency Ordered Stop   09/19/16 1000  azithromycin  (ZITHROMAX) tablet 250 mg     250 mg Oral Daily 09/18/16 1424 09/23/16 0959   09/18/16 2200  piperacillin-tazobactam (ZOSYN) IVPB 3.375 g  Status:  Discontinued     3.375 g 12.5 mL/hr over 240 Minutes Intravenous Every 8 hours 09/18/16 1303 09/18/16 1317   09/18/16 1430  azithromycin (ZITHROMAX) tablet 500 mg     500 mg Oral Daily 09/18/16 1424 09/18/16 1537   09/18/16 1400  meropenem (MERREM) 1 g in sodium chloride 0.9 % 100 mL IVPB     1 g 200 mL/hr over 30 Minutes Intravenous Every 8 hours 09/18/16 1319     09/17/16 1800  piperacillin-tazobactam (ZOSYN) IVPB 3.375 g  Status:  Discontinued     3.375 g 12.5 mL/hr over 240 Minutes Intravenous Every 6 hours 09/17/16 1457 09/18/16 1303   09/17/16 0945  piperacillin-tazobactam (ZOSYN) IVPB 3.375 g     3.375 g 12.5 mL/hr over 240 Minutes Intravenous  Once 09/17/16 0939 09/17/16 1043      Assessment/Plan:  20 year old male with acute cholecystitis, Klebsiella bacteremia, multifocal pneumonia. Patient continues to be a poor surgical candidate secondary to his pneumonia. Discussed this with the patient and his mother. Plan would be for a percutaneous cholecystostomy tube. This will allow temporization of his gallbladder problem and continued medical management of his bacteremia and pneumonia. Surgery will continue to follow along.  Isam Unrein T. Tonita Cong, MD, FACS  09/20/2016

## 2016-09-20 NOTE — Progress Notes (Signed)
Pt alert and oriented, one complaint of abdominal pain relieved with dilaudid.  Sinus tachycardia on telemetry, hr ranges from 110-140's (depeding on if febrile).  Pt temp max 103.3 twice tonight, relieved with scheduled tylenol.  6L [place], remains npo, tolerates sips of water.  Adequate urine output.  Vital signs stable.

## 2016-09-20 NOTE — Progress Notes (Signed)
Dr Dema SeverinMungal notified of pt's high RR of 43, HR 130s, shivers and fever of 102, MD at bedside talking with family, new order obtained to administer ativan and reassess for additional pain med admin if ativan is ineffective. Will monitor closely.

## 2016-09-20 NOTE — Progress Notes (Signed)
Patient given 1000mg  tylenol with sips of gingerale.

## 2016-09-20 NOTE — Progress Notes (Signed)
MEDICATION RELATED CONSULT NOTE - INITIAL   Pharmacy Consult for Resuming Anticoagulation S/P IR  Indication: Heparin s/c s/p biliary drain placement  No Known Allergies  Patient Measurements: Height: 5' (152.4 cm) Weight: 213 lb 6.5 oz (96.8 kg) IBW/kg (Calculated) : 50  Vital Signs: Temp: 101 F (38.3 C) (10/11 1037) Temp Source: Oral (10/11 1037) BP: 127/93 (10/11 1100) Pulse Rate: 111 (10/11 1059) Intake/Output from previous day: 10/10 0701 - 10/11 0700 In: 675 [I.V.:675] Out: 1300 [Urine:1300] Intake/Output from this shift: Total I/O In: -  Out: 100 [Drains:100]  Labs:  Recent Labs  09/18/16 0601 09/19/16 0433  WBC 21.5* 15.2*  HGB 14.9 14.4  HCT 42.1 40.8  PLT 193 185  CREATININE 1.10 1.00  ALBUMIN 3.3* 2.9*  PROT 6.0* 6.0*  AST 16 17  ALT 23 20  ALKPHOS 70 63  BILITOT 1.5* 1.2   Estimated Creatinine Clearance: 113.5 mL/min (by C-G formula based on SCr of 1 mg/dL).   Microbiology: Recent Results (from the past 720 hour(s))  Blood Culture (routine x 2)     Status: Abnormal   Collection Time: 09/17/16  9:44 AM  Result Value Ref Range Status   Specimen Description BLOOD LEFT ANTECUBITAL  Final   Special Requests BOTTLES DRAWN AEROBIC AND ANAEROBIC  10CC  Final   Culture  Setup Time   Final    GRAM NEGATIVE RODS IN BOTH AEROBIC AND ANAEROBIC BOTTLES CRITICAL RESULT CALLED TO, READ BACK BY AND VERIFIED WITH: MATT MCBANE AT 0302 ON 09/18/16 RWW CONFIRMED BY PMH Performed [facility]    Culture KLEBSIELLA PNEUMONIAE (A)  Final   Report Status 09/20/2016 FINAL  Final   Organism ID, Bacteria KLEBSIELLA PNEUMONIAE  Final      Susceptibility   Klebsiella pneumoniae - MIC*    AMPICILLIN >=32 RESISTANT Resistant     CEFAZOLIN <=4 SENSITIVE Sensitive     CEFEPIME <=1 SENSITIVE Sensitive     CEFTAZIDIME <=1 SENSITIVE Sensitive     CEFTRIAXONE <=1 SENSITIVE Sensitive     CIPROFLOXACIN <=0.25 SENSITIVE Sensitive     GENTAMICIN <=1 SENSITIVE  Sensitive     IMIPENEM <=0.25 SENSITIVE Sensitive     TRIMETH/SULFA <=20 SENSITIVE Sensitive     AMPICILLIN/SULBACTAM 4 SENSITIVE Sensitive     PIP/TAZO <=4 SENSITIVE Sensitive     Extended ESBL NEGATIVE Sensitive     * KLEBSIELLA PNEUMONIAE  Blood Culture (routine x 2)     Status: Abnormal   Collection Time: 09/17/16  9:44 AM  Result Value Ref Range Status   Specimen Description BLOOD LEFT FOREARM  Final   Special Requests BOTTLES DRAWN AEROBIC AND ANAEROBIC  10CC  Final   Culture  Setup Time   Final    GRAM NEGATIVE RODS ANAEROBIC BOTTLE ONLY CRITICAL VALUE NOTED.  VALUE IS CONSISTENT WITH PREVIOUSLY REPORTED AND CALLED VALUE. CONFIRMED BY PMH    Culture (A)  Final    KLEBSIELLA PNEUMONIAE SUSCEPTIBILITIES PERFORMED ON PREVIOUS CULTURE WITHIN THE LAST 5 DAYS. Performed at [facility]    Report Status 09/20/2016 FINAL  Final  Blood Culture ID Panel (Reflexed)     Status: Abnormal   Collection Time: 09/17/16  9:44 AM  Result Value Ref Range Status   Enterococcus species NOT DETECTED NOT DETECTED Final   Listeria monocytogenes NOT DETECTED NOT DETECTED Final   Staphylococcus species NOT DETECTED NOT DETECTED Final   Staphylococcus aureus NOT DETECTED NOT DETECTED Final   Streptococcus species NOT DETECTED NOT DETECTED Final  Streptococcus agalactiae NOT DETECTED NOT DETECTED Final   Streptococcus pneumoniae NOT DETECTED NOT DETECTED Final   Streptococcus pyogenes NOT DETECTED NOT DETECTED Final   Acinetobacter baumannii NOT DETECTED NOT DETECTED Final   Enterobacteriaceae species DETECTED (A) NOT DETECTED Final    Comment: CRITICAL RESULT CALLED TO, READ BACK BY AND VERIFIED WITH: MATT MCBANE AT 0302 ON 09/18/16 RWW    Enterobacter cloacae complex NOT DETECTED NOT DETECTED Final   Escherichia coli NOT DETECTED NOT DETECTED Final   Klebsiella oxytoca NOT DETECTED NOT DETECTED Final   Klebsiella pneumoniae DETECTED (A) NOT DETECTED Final    Comment: CRITICAL RESULT  CALLED TO, READ BACK BY AND VERIFIED WITH: MATT MCBANE AT 0302 ON 09/18/16 RWW    Proteus species NOT DETECTED NOT DETECTED Final   Serratia marcescens NOT DETECTED NOT DETECTED Final   Carbapenem resistance NOT DETECTED NOT DETECTED Final   Haemophilus influenzae NOT DETECTED NOT DETECTED Final   Neisseria meningitidis NOT DETECTED NOT DETECTED Final   Pseudomonas aeruginosa NOT DETECTED NOT DETECTED Final   Candida albicans NOT DETECTED NOT DETECTED Final   Candida glabrata NOT DETECTED NOT DETECTED Final   Candida krusei NOT DETECTED NOT DETECTED Final   Candida parapsilosis NOT DETECTED NOT DETECTED Final   Candida tropicalis NOT DETECTED NOT DETECTED Final  MRSA PCR Screening     Status: None   Collection Time: 09/17/16  7:35 PM  Result Value Ref Range Status   MRSA by PCR NEGATIVE NEGATIVE Final    Comment:        The GeneXpert MRSA Assay (FDA approved for NASAL specimens only), is one component of a comprehensive MRSA colonization surveillance program. It is not intended to diagnose MRSA infection nor to guide or monitor treatment for MRSA infections.     Medical History: Past Medical History:  Diagnosis Date  . Autism   . Irritable bowel syndrome     Assessment: 20 y/o M admitted with acute cholecystitis and ARDS, developing PNA. Patient not stable enough from surgery standpoint so IR consulted for biliary drain. Pharmacy consulted to resume DVT px heparin after procedure.   Plan:  Recommendation is to resume [place] heparin at least 8 hours after procedure. Will resume with next dose at 2200.  Luisa Hart D 09/20/2016,11:32 AM   .

## 2016-09-20 NOTE — Progress Notes (Signed)
Pharmacy Antibiotic Note  Joshua [name] is a 20 y.o. male admitted on 09/17/2016 with cholecystitis and Klebsiella bacteremia. Patient admitted to ICU for ARDS. Initially on Zosyn but will escalate to meropenem to cover for possible ESBL Klebsiella pneumoniae due to patient's deteriorating clinical condition. Azithromycin added for atypical coverage for PNA.  Plan: Continue Meropenem 1 g iv q 8 hours. Will f/u MD on second rounds to deescalate abx based on sensitivities.   Height: 5' (152.4 cm) Weight: 213 lb 6.5 oz (96.8 kg) IBW/kg (Calculated) : 50  Temp (24hrs), Avg:100.7 F (38.2 C), Min:98.7 F (37.1 C), Max:103.3 F (39.6 C)   Recent Labs Lab 09/17/16 0918 09/17/16 0943 09/18/16 0601 09/19/16 0433  WBC 21.9*  --  21.5* 15.2*  CREATININE 1.02  --  1.10 1.00  LATICACIDVEN  --  1.4  --   --     Estimated Creatinine Clearance: 113.5 mL/min (by C-G formula based on SCr of 1 mg/dL).    No Known Allergies  Antimicrobials this admission: Zosyn 10/8 >> 10/9 Meropenem 10/9 >>  Azithromycin 10/9 >>   Dose adjustments this admission:   Microbiology results: 10/8 BCx: K pneumoniae pan-sensitive except ampicillin 10/8 MRSA PCR: negative  Thank you for allowing pharmacy to be a part of this patient's care.  Luisa HartChristy, Aeron Lheureux D 09/20/2016 11:38 AM

## 2016-09-20 NOTE — Procedures (Signed)
Technically successful US and Fluoro guided placed of a 10 Fr drainage catheter placement into the gallbladder for acute cholecystitis. Chole tube connected to gravity bag. EBL: Minimal No immediate post procedural complications.   Katherina RightJay Laurina Fischl, MD Pager #: [phone]

## 2016-09-20 NOTE — Progress Notes (Signed)
Chaplain rounded the waiting room and hall way and provided a compassionate presence to the family of the patient who was waiting to be scheduled for a procedure. Jefm PettyChaplain Veldon Wager [phone]

## 2016-09-20 NOTE — Progress Notes (Deleted)
Pt is alert and oriented, no complaint of pain.  NSR/ST on telemetry.  Remains on ventilator.  Adequate urine output, Vital signs stable.  Pt had liquid BM this shift.  No complaints.

## 2016-09-20 NOTE — Progress Notes (Signed)
Patient was changed over to a 100% non rebreather prior to procedure beginning due to sats of 90% on a 6l [place]. RT observed patient for most of procedure, patients sats stayed between 93 to 96% on the NRB.

## 2016-09-20 NOTE — Consult Note (Signed)
[name]      Assessment and Plan:  A: -Acute hypoxemic respiratory failure. Suspect pneumonia, likely related to underlying acute cholecystitis. -Sepsis, secondary to above. -Right lower lobe atelectasis with elevated right sided diaphragm. -History of autism.  P: Broad-spectrum antibiotics.  Azithromycin Meropenem 10/10>  CTA chest with no PE on 10/9 Now s/p ultrasound guided cholecystostomy tube placement by IR on 10/11   Date: 09/20/2016  MRN# [id number] Joshua [name] 30-Aug-1996  Referring Physician: Dr. Elpidio Anis.   Joshua [name] is a 20 y.o. old male seen in consultation for chief complaint of:    Chief Complaint  Patient presents with  . Abdominal Pain    HPI:  Patient is a 20 year old male, he is accompanied by his mother. Patient presented to the hospital with increasing right upper quadrant abdominal pain. CT abdomen was performed on 09/17/16, suggestive of acute cholecystitis. Over the last 24 hours. Patient's oxygen saturations have dropped initially was on room air. Subsequently, this has been increased to 6 L via nasal cannula. The patient complains of mild dyspnea, however, he does not look to be in any respiratory distress.  On review of the patient's imaging. There does appear to be an elevated right-sided diaphragm, possible new right sided infiltrates, when combined with the presence of fever. This may be suggestive of pneumonia.  SUBJ: Still with temp elevations over night, no s/p chole tubes but with inc RR and fevers.    PMHX:   Past Medical History:  Diagnosis Date  . Autism   . Irritable bowel syndrome    Surgical Hx:  Past Surgical History:  Procedure Laterality Date  . EYE SURGERY    . IR GENERIC HISTORICAL  09/20/2016   IR PERC CHOLECYSTOSTOMY 09/20/2016 Joshua Come, MD [facility]-INTER[facility] RAD   Family Hx:  History reviewed. No pertinent family history. Social Hx:   Social History  Substance Use Topics  .  Smoking status: Never Smoker  . Smokeless tobacco: Never Used  . Alcohol use Not on file   Medication:       Allergies:  Review of patient's allergies indicates no known allergies.  Review of Systems: Gen: admits  fever, sweats, chills Cvc:  No dizziness, chest pain. Resp:   Denies cough or sputum production Gi: Denies swallowing difficulty,  Admits nausea, admits abdominal pain Gu:  Denies bladder incontinence, burning urine Ext:   No Joint pain, stiffness. Skin: No skin rash,  hives  Endoc:  No polyuria, polydipsia. Psych: No depression, insomnia. Other:  All other systems were reviewed with the patient and were negative other that what is mentioned in the HPI.   Physical Examination:   VS: BP (!) 153/88   Pulse (!) 132   Temp (!) 102.1 F (38.9 C) (Oral)   Resp (!) 44   Ht 5' (1.524 m)   Wt 213 lb 6.5 oz (96.8 kg)   SpO2 97%   BMI 41.68 kg/m   General Appearance: No distress  Neuro:without focal findings,  speech normal,  HEENT: PERRLA, EOM intact.   Pulmonary: normal breath sounds, No wheezing. Decreased air entry in both lung bases. tachypnea CardiovascularNormal S1,S2.  No m/r/g.   Abdomen: Benign, Soft, moderate RUQ tenderness Renal:  No costovertebral tenderness  GU:  No performed at this time. Endoc: No evident thyromegaly, no signs of acromegaly. Skin:   warm, no rashes, no ecchymosis  Extremities: normal, no cyanosis, clubbing.  Other findings:    LABORATORY PANEL:   CBC  Recent Labs Lab 09/19/16 0433  WBC 15.2*  HGB 14.4  HCT 40.8  PLT 185   ------------------------------------------------------------------------------------------------------------------  Chemistries   Recent Labs Lab 09/19/16 0433  NA 133*  K 3.7  CL 100*  CO2 26  GLUCOSE 94  BUN 12  CREATININE 1.00  CALCIUM 8.4*  AST 17  ALT 20  ALKPHOS 63  BILITOT 1.2    ------------------------------------------------------------------------------------------------------------------  Cardiac Enzymes  Recent Labs Lab 09/17/16 0918  TROPONINI <0.03   ------------------------------------------------------------  RADIOLOGY:  Ct Angio Chest Pe W Or Wo Contrast  Result Date: 09/18/2016 CLINICAL DATA:  Initial evaluation for acute dyspnea. EXAM: CT ANGIOGRAPHY CHEST WITH CONTRAST TECHNIQUE: Multidetector CT imaging of the chest was performed using the standard protocol during bolus administration of intravenous contrast. Multiplanar CT image reconstructions and MIPs were obtained to evaluate the vascular anatomy. CONTRAST:  75 cc of Isovue 370. COMPARISON:  Prior radiograph from earlier same day. FINDINGS: Cardiovascular: Intrathoracic aorta of normal caliber and appearance without acute abnormality. Visualized great vessels within normal limits. Heart size normal. No pericardial effusion. Pulmonary arteries adequately opacified for evaluation. Main pulmonary artery measures within normal limits for size at 2.2 cm in diameter. No filling defect to suggest acute pulmonary embolism. Re-formatted imaging confirms these findings. Mediastinum/Nodes: The visualized thyroid is normal. No pathologically enlarged mediastinal or hilar lymph nodes identified. No axillary adenopathy. Esophagus within normal limits. Lungs/Pleura: Patchy multi focal consolidation present within the posterior aspect of both upper and lower lobes, concerning for multifocal pneumonia. Changes most severe on the right. Findings may be infectious nature, although possible aspiration could be considered. Associated small right pleural effusion. No pulmonary edema. No pneumothorax. No worrisome pulmonary nodule or mass. Tracheobronchial tree remains patent. Upper Abdomen: Visualized portions of the upper abdomen are unremarkable. Musculoskeletal: No acute osseous abnormality. No worrisome lytic or blastic  osseous lesions. Review of the MIP images confirms the above findings. IMPRESSION: 1. No CT evidence for acute pulmonary embolism. 2. Multi focal areas of consolidation involving both the right upper and lower lobes, concerning for multifocal pneumonia. Changes are slightly worse on the right. Findings may be infectious in nature. Possible aspiration pneumonitis could also be considered. 3. Small right pleural effusion. Electronically Signed   By: Rise Mu M.D.   On: 09/18/2016 14:27   Nm Hepatobiliary Liver Func  Result Date: 09/19/2016 CLINICAL DATA:  Acute cholecystitis. Right upper quadrant pain for 3 days. EXAM: NUCLEAR MEDICINE HEPATOBILIARY IMAGING TECHNIQUE: Sequential images of the abdomen were obtained out to 60 minutes following intravenous administration of radiopharmaceutical. RADIOPHARMACEUTICALS:  4.938 mCi Tc-68m Choletec IV. After 2 hours, 3 mg of morphine was injected and an additional 2.152 mCi Tc-2m Choletec injected IV. COMPARISON:  Abdominal CT 07/20/2016 FINDINGS: Prompt uptake and biliary excretion of activity by the liver is seen. Gallbladder activity is not visualized over the course of 2 hours. There is biliary activity and activity passes into the small bowel. 3 mg morphine administered IV followed by additional Choletec injection. No gallbladder was visualized after an additional 30 minutes. IMPRESSION: No gallbladder visualization consistent with acute cholecystitis, even after the administration of IV morphine. Electronically Signed   By: Rubye Oaks M.D.   On: 09/19/2016 21:49   [place] Intraoperative  Result Date: 09/20/2016 CLINICAL DATA:  Ultrasound was provided for use by the ordering physician, and a technical charge was applied by the performing facility.  No radiologist interpretation/professional services rendered.   Ir Perc Cholecystostomy  Result Date: 09/20/2016 INDICATION: Patient admitted with  acute cholecystitis complicated by development of  multi lobar pneumonia. As such, patient deemed a poor operative candidate and request made for placement of a percutaneous cholecystostomy tube for infection source control. EXAM: ULTRASOUND AND FLUOROSCOPIC-GUIDED CHOLECYSTOSTOMY TUBE PLACEMENT COMPARISON:  CT scan of the abdomen and pelvis - 09/17/2016; nuclear medicine HIDA scan - 09/19/2016; chest CT - 09/18/2026 MEDICATIONS: The patient is currently admitted to the hospital and on intravenous antibiotics. Antibiotics were administered within an appropriate time frame prior to skin puncture. ANESTHESIA/SEDATION: Moderate (conscious) sedation was employed during this procedure. A total of Versed 2.5 mg and Fentanyl 75 mcg was administered intravenously. Moderate Sedation Time: 20 minutes. The patient's level of consciousness and vital signs were monitored continuously by radiology nursing throughout the procedure under my direct supervision. CONTRAST:  10mL ISOVUE-300 IOPAMIDOL (ISOVUE-300) INJECTION 61% - administered into the gallbladder fossa. FLUOROSCOPY TIME:  36 seconds (10 mGy) COMPLICATIONS: None immediate. PROCEDURE: Informed written consent was obtained from the patient's mother after a discussion of the risks, benefits and alternatives to treatment. Questions regarding the procedure were encouraged and answered. A timeout was performed prior to the initiation of the procedure. The right upper abdominal quadrant was prepped and draped in the usual sterile fashion, and a sterile drape was applied covering the operative field. Maximum barrier sterile technique with sterile gowns and gloves were used for the procedure. A timeout was performed prior to the initiation of the procedure. Local anesthesia was provided with 1% lidocaine with epinephrine. Ultrasound scanning of the right upper quadrant demonstrates a mildly distended gallbladder with minimal amount of gallbladder wall thickening. Utilizing a transhepatic approach, a 22 gauge needle was advanced  into the gallbladder under direct ultrasound guidance. An ultrasound image was saved for documentation purposes. Appropriate intraluminal puncture was confirmed with the efflux of bile and advancement of an 0.018 wire into the gallbladder lumen. The needle was exchanged for an Accustick set. A small amount of contrast was injected to confirm appropriate intraluminal positioning. Over a Benson wire, a 10.2-French Cook cholecystomy tube was advanced into the gallbladder fossa, coiled and locked. Bile was aspirated and a small amount of contrast was injected as several post procedural spot fluoroscopic images were obtained in various obliquities. A small amount of bile was aspirated, capped and sent to the laboratory for analysis. The catheter was secured to the skin with suture, connected to a drainage bag and a dressing was placed. The patient tolerated the procedure well without immediate post procedural complication. IMPRESSION: Successful ultrasound and fluoroscopic guided placement of a 10.2 French cholecystostomy tube. A small sample of aspirated bile was sent to the laboratory for analysis. Electronically Signed   By: Joshua [name] M.D.   On: 09/20/2016 11:06   Dg Chest Port 1 View  Result Date: 09/19/2016 CLINICAL DATA:  Pneumothorax EXAM: PORTABLE CHEST 1 VIEW COMPARISON:  Yesterday FINDINGS: Lungs remain under aerated. Bilateral airspace disease is worse. Normal heart size. Upper mediastinum is prominent likely due to low volumes and vascular crowding. There is no pneumothorax. IMPRESSION: Worsening bilateral airspace disease. Electronically Signed   By: Jolaine Click M.D.   On: 09/19/2016 07:50       Thank  you for the consultation and for allowing [facility] to assist in the care of your patient. Our recommendations are noted above.  Please contact us if we can be of further service.  CCM Time - 35 mins  Stephanie Acre, MD [place] [facility] Pager -  [phone] (please enter 7-digits) On Call Pager - [phone] (please enter [phone])   09/20/2016

## 2016-09-20 NOTE — Progress Notes (Signed)
[facility] - [place] at [facility]   PATIENT NAME: Joshua [name]    MR#:  [id number]  DATE OF BIRTH:  11-25-1996  SUBJECTIVE:  CHIEF COMPLAINT:   Chief Complaint  Patient presents with  . Abdominal Pain   Seen earlier today before his cholecystostomy procedure Still has abd pain and SOB.  Continues to be tachycardic on 6 L oxygen.   REVIEW OF SYSTEMS:    Review of Systems  Constitutional: Positive for malaise/fatigue. Negative for chills and fever.  HENT: Negative for sore throat.   Eyes: Negative for blurred vision, double vision and pain.  Respiratory: Positive for cough and shortness of breath. Negative for hemoptysis and wheezing.   Cardiovascular: Negative for chest pain, palpitations, orthopnea and leg swelling.  Gastrointestinal: Positive for abdominal pain and nausea. Negative for constipation, diarrhea, heartburn and vomiting.  Genitourinary: Negative for dysuria and hematuria.  Musculoskeletal: Negative for back pain and joint pain.  Skin: Negative for rash.  Neurological: Positive for weakness. Negative for sensory change, speech change, focal weakness and headaches.  Endo/Heme/Allergies: Does not bruise/bleed easily.  Psychiatric/Behavioral: Negative for depression. The patient is not nervous/anxious.     DRUG ALLERGIES:  No Known Allergies  VITALS:  Blood pressure (!) 127/93, pulse (!) 111, temperature (!) 101 F (38.3 C), temperature source Oral, resp. rate (!) 52, height 5' (1.524 m), weight 96.8 kg (213 lb 6.5 oz), SpO2 98 %.  PHYSICAL EXAMINATION:   Physical Exam  GENERAL:  20 y.o.-year-old patient lying in the bed and looks critically ill EYES: Pupils equal, round, reactive to light and accommodation. No scleral icterus. Extraocular muscles intact.  HEENT: Head atraumatic, normocephalic. Oropharynx and nasopharynx clear.  NECK:  Supple, no jugular venous distention. No thyroid enlargement, no tenderness.  LUNGS: bilateral  ronchi with decreased breath sounds. Shallow breathing CARDIOVASCULAR: S1, S2 normal. No murmurs, rubs, or gallops.  ABDOMEN: Soft, nondistended. Bowel sounds present. No organomegaly or mass. Tender. EXTREMITIES: No cyanosis, clubbing or edema b/l.     PSYCHIATRIC: The patient is alert and oawake SKIN: No obvious rash, lesion, or ulcer.   LABORATORY PANEL:   CBC  Recent Labs Lab 09/19/16 0433  WBC 15.2*  HGB 14.4  HCT 40.8  PLT 185   ------------------------------------------------------------------------------------------------------------------ Chemistries   Recent Labs Lab 09/19/16 0433  NA 133*  K 3.7  CL 100*  CO2 26  GLUCOSE 94  BUN 12  CREATININE 1.00  CALCIUM 8.4*  AST 17  ALT 20  ALKPHOS 63  BILITOT 1.2   ------------------------------------------------------------------------------------------------------------------  Cardiac Enzymes  Recent Labs Lab 09/17/16 0918  TROPONINI <0.03   ------------------------------------------------------------------------------------------------------------------  RADIOLOGY:  Dg Chest 1 View  Result Date: 09/18/2016 CLINICAL DATA:  Shortness of breath and right upper abdominal pain. EXAM: CHEST 1 VIEW COMPARISON:  09/17/2016 FINDINGS: Lung volumes are lower compared to the prior study with atelectasis present bilaterally, especially in the right perihilar lung. No overt edema or pleural effusion. The heart size and mediastinal contours are normal. IMPRESSION: Lower lung volumes with more prominent atelectasis bilaterally, most prominently in the right perihilar region. Electronically Signed   By: Irish Lack M.D.   On: 09/18/2016 12:13   Ct Angio Chest Pe W Or Wo Contrast  Result Date: 09/18/2016 CLINICAL DATA:  Initial evaluation for acute dyspnea. EXAM: CT ANGIOGRAPHY CHEST WITH CONTRAST TECHNIQUE: Multidetector CT imaging of the chest was performed using the standard protocol during bolus administration of  intravenous contrast. Multiplanar CT image reconstructions and MIPs were obtained  to evaluate the vascular anatomy. CONTRAST:  75 cc of Isovue 370. COMPARISON:  Prior radiograph from earlier same day. FINDINGS: Cardiovascular: Intrathoracic aorta of normal caliber and appearance without acute abnormality. Visualized great vessels within normal limits. Heart size normal. No pericardial effusion. Pulmonary arteries adequately opacified for evaluation. Main pulmonary artery measures within normal limits for size at 2.2 cm in diameter. No filling defect to suggest acute pulmonary embolism. Re-formatted imaging confirms these findings. Mediastinum/Nodes: The visualized thyroid is normal. No pathologically enlarged mediastinal or hilar lymph nodes identified. No axillary adenopathy. Esophagus within normal limits. Lungs/Pleura: Patchy multi focal consolidation present within the posterior aspect of both upper and lower lobes, concerning for multifocal pneumonia. Changes most severe on the right. Findings may be infectious nature, although possible aspiration could be considered. Associated small right pleural effusion. No pulmonary edema. No pneumothorax. No worrisome pulmonary nodule or mass. Tracheobronchial tree remains patent. Upper Abdomen: Visualized portions of the upper abdomen are unremarkable. Musculoskeletal: No acute osseous abnormality. No worrisome lytic or blastic osseous lesions. Review of the MIP images confirms the above findings. IMPRESSION: 1. No CT evidence for acute pulmonary embolism. 2. Multi focal areas of consolidation involving both the right upper and lower lobes, concerning for multifocal pneumonia. Changes are slightly worse on the right. Findings may be infectious in nature. Possible aspiration pneumonitis could also be considered. 3. Small right pleural effusion. Electronically Signed   By: Benjamin  McClintock M.D.   On: 09/18/2016 14:27   Nm Hepatobiliary Liver Func  Result Date:  09/19/2016 CLINICAL DATA:  Acute cholecystitis. Right upper quadrant pain for 3 days. EXAM: NUCLEAR MEDICINE HEPATOBILIARY IMAGING TECHNIQUE: Sequential images of the abdomen were obtained out to 60 minutes following intravenous administration of radiopharmaceutical. RADIOPHARMACEUTICALS:  4.938 mCi Tc-79m Choletec IV. After 2 hours, 3 mg of morphine was injected and an additional 2.152 mCi Tc-79m Choletec injected IV. COMPARISON:  Abdominal CT 07/20/2016 FINDINGS: Prompt uptake and biliary excretion of activity by the liver is seen. Gallbladder activity is not visualized over the course of 2 hours. There is biliary activity and activity passes into the small bowel. 3 mg morphine administered IV followed by additional Choletec injection. No gallbladder was visualized after an additional 30 minutes. IMPRESSION: No gallbladder visualization consistent with acute cholecystitis, even after the administration of IV morphine. Electronically Signed   By: Melanie  Ehinger M.D.   On: 09/19/2016 21:49   [place] Intraoperative  Result Date: 09/20/2016 CLINICAL DATA:  Ultrasound was provided for use by the ordering physician, and a technical charge was applied by the performing facility.  No radiologist interpretation/professional services rendered.   Ir Perc Cholecystostomy  Result Date: 09/20/2016 INDICATION: Patient admitted with acute cholecystitis complicated by development of multi lobar pneumonia. As such, patient deemed a poor operative candidate and request made for placement of a percutaneous cholecystostomy tube for infection source control. EXAM: ULTRASOUND AND FLUOROSCOPIC-GUIDED CHOLECYSTOSTOMY TUBE PLACEMENT COMPARISON:  CT scan of the abdomen and pelvis - 09/17/2016; nuclear medicine HIDA scan - 09/19/2016; chest CT - 09/18/2026 MEDICATIONS: The patient is currently admitted to the hospital and on intravenous antibiotics. Antibiotics were administered within an appropriate time frame prior to skin  puncture. ANESTHESIA/SEDATION: Moderate (conscious) sedation was employed during this procedure. A total of Versed 2.5 mg and Fentanyl 75 mcg was administered intravenously. Moderate Sedation Time: 20 minutes. The patient's level of consciousness and vital signs were monitored continuously by radiology nursing throughout the procedure under my direct supervision. CONTRAST:  48mL ISOVUE-300 IOPAMIDOL (ISOVUE-300) INJECTION  61% - administered into the gallbladder fossa. FLUOROSCOPY TIME:  36 seconds (10 mGy) COMPLICATIONS: None immediate. PROCEDURE: Informed written consent was obtained from the patient's mother after a discussion of the risks, benefits and alternatives to treatment. Questions regarding the procedure were encouraged and answered. A timeout was performed prior to the initiation of the procedure. The right upper abdominal quadrant was prepped and draped in the usual sterile fashion, and a sterile drape was applied covering the operative field. Maximum barrier sterile technique with sterile gowns and gloves were used for the procedure. A timeout was performed prior to the initiation of the procedure. Local anesthesia was provided with 1% lidocaine with epinephrine. Ultrasound scanning of the right upper quadrant demonstrates a mildly distended gallbladder with minimal amount of gallbladder wall thickening. Utilizing a transhepatic approach, a 22 gauge needle was advanced into the gallbladder under direct ultrasound guidance. An ultrasound image was saved for documentation purposes. Appropriate intraluminal puncture was confirmed with the efflux of bile and advancement of an 0.018 wire into the gallbladder lumen. The needle was exchanged for an Accustick set. A small amount of contrast was injected to confirm appropriate intraluminal positioning. Over a Benson wire, a 10.2-French Cook cholecystomy tube was advanced into the gallbladder fossa, coiled and locked. Bile was aspirated and a small amount of  contrast was injected as several post procedural spot fluoroscopic images were obtained in various obliquities. A small amount of bile was aspirated, capped and sent to the laboratory for analysis. The catheter was secured to the skin with suture, connected to a drainage bag and a dressing was placed. The patient tolerated the procedure well without immediate post procedural complication. IMPRESSION: Successful ultrasound and fluoroscopic guided placement of a 10.2 French cholecystostomy tube. A small sample of aspirated bile was sent to the laboratory for analysis. Electronically Signed   By: Simonne ComeJohn  Watts M.D.   On: 09/20/2016 11:06   Dg Chest Port 1 View  Result Date: 09/19/2016 CLINICAL DATA:  Pneumothorax EXAM: PORTABLE CHEST 1 VIEW COMPARISON:  Yesterday FINDINGS: Lungs remain under aerated. Bilateral airspace disease is worse. Normal heart size. Upper mediastinum is prominent likely due to low volumes and vascular crowding. There is no pneumothorax. IMPRESSION: Worsening bilateral airspace disease. Electronically Signed   By: Jolaine ClickArthur  Hoss M.D.   On: 09/19/2016 07:50     ASSESSMENT AND PLAN:   Patient is a 20 year old admitted with acute cholecystitis now acute respiratory failure  * Multifocal pneumonia likely aspirational with Acute hypoxic respiratory failure and sepsis On IV antibiotics. nebswhen necessary. Wean oxygen as tolerated. Acutely ill.  * Acute cholecystitis - HIDA scan positive On IV antibiotics. . Not a candidate for cholecystectomy today improves with his respiratory status. Schedule for percutaneous cholecystostomy today  * DVT prophylaxis: lovenox   All the records are reviewed and case discussed with Care Management/Social Workerr. Management plans discussed with the patient, family and they are in agreement.  CODE STATUS: FULL CODE  DVT Prophylaxis: SCDs  TOTAL CC TIME TAKING CARE OF THIS PATIENT: 34 minutes.   We'll sign off. Patient is being followed by  ICU team while in ICU.  Milagros LollSudini, Quiera Diffee R M.D on 09/20/2016 at 11:55 AM  Between 7am to 6pm - Pager - [phone]  After 6pm go to [email or web address] - password [email or web address] [facility]  [facility]  Office  [phone]  CC: Primary care physician; No PCP Per Patient  Note: This dictation was prepared with Dragon dictation along with smaller phrase technology. Any transcriptional errors  that result from this process are unintentional.

## 2016-09-21 DIAGNOSIS — R0600 Dyspnea, unspecified: Secondary | ICD-10-CM

## 2016-09-21 DIAGNOSIS — R7881 Bacteremia: Secondary | ICD-10-CM

## 2016-09-21 DIAGNOSIS — R509 Fever, unspecified: Secondary | ICD-10-CM

## 2016-09-21 LAB — CBC WITH DIFFERENTIAL/PLATELET
BASOS PCT: 0 %
Basophils Absolute: 0 10*3/uL (ref 0–0.1)
EOS ABS: 0 10*3/uL (ref 0–0.7)
Eosinophils Relative: 0 %
HCT: 41.2 % (ref 40.0–52.0)
HEMOGLOBIN: 14.6 g/dL (ref 13.0–18.0)
Lymphocytes Relative: 12 %
Lymphs Abs: 0.7 10*3/uL — ABNORMAL LOW (ref 1.0–3.6)
MCH: 30.1 pg (ref 26.0–34.0)
MCHC: 35.4 g/dL (ref 32.0–36.0)
MCV: 85.1 fL (ref 80.0–100.0)
MONO ABS: 0.7 10*3/uL (ref 0.2–1.0)
MONOS PCT: 12 %
NEUTROS PCT: 76 %
Neutro Abs: 4.5 10*3/uL (ref 1.4–6.5)
Platelets: 185 10*3/uL (ref 150–440)
RBC: 4.85 MIL/uL (ref 4.40–5.90)
RDW: 12.7 % (ref 11.5–14.5)
WBC: 5.9 10*3/uL (ref 3.8–10.6)

## 2016-09-21 LAB — BASIC METABOLIC PANEL
Anion gap: 11 (ref 5–15)
BUN: 11 mg/dL (ref 6–20)
CALCIUM: 8.2 mg/dL — AB (ref 8.9–10.3)
CO2: 26 mmol/L (ref 22–32)
CREATININE: 0.89 mg/dL (ref 0.61–1.24)
Chloride: 96 mmol/L — ABNORMAL LOW (ref 101–111)
Glucose, Bld: 97 mg/dL (ref 65–99)
Potassium: 3.5 mmol/L (ref 3.5–5.1)
Sodium: 133 mmol/L — ABNORMAL LOW (ref 135–145)

## 2016-09-21 MED ORDER — AMPICILLIN-SULBACTAM SODIUM 3 (2-1) G IJ SOLR
3.0000 g | Freq: Four times a day (QID) | INTRAMUSCULAR | Status: DC
  Administered 2016-09-21 – 2016-09-22 (×4): 3 g via INTRAVENOUS
  Filled 2016-09-21 (×6): qty 3

## 2016-09-21 MED ORDER — IBUPROFEN 600 MG PO TABS
600.0000 mg | ORAL_TABLET | ORAL | Status: DC | PRN
  Administered 2016-09-21 – 2016-09-23 (×6): 600 mg via ORAL
  Filled 2016-09-21 (×6): qty 1

## 2016-09-21 MED ORDER — ACETAMINOPHEN 10 MG/ML IV SOLN
1000.0000 mg | Freq: Four times a day (QID) | INTRAVENOUS | Status: DC | PRN
  Administered 2016-09-21: 1000 mg via INTRAVENOUS
  Filled 2016-09-21 (×3): qty 100

## 2016-09-21 MED ORDER — ACETAMINOPHEN 325 MG PO TABS
650.0000 mg | ORAL_TABLET | ORAL | Status: DC | PRN
  Administered 2016-09-21 – 2016-09-22 (×2): 650 mg via ORAL
  Filled 2016-09-21 (×2): qty 2

## 2016-09-21 NOTE — Progress Notes (Signed)
CC: Cholecystitis Subjective: Patient underwent a percutaneous cholecystostomy tube placement yesterday. This was uneventful. Continues to have intermittent fevers but having improving pulmonary function.  Objective: Vital signs in last 24 hours: Temp:  [98.1 F (36.7 C)-103.3 F (39.6 C)] 102.7 F (39.3 C) (10/12 1428) Pulse Rate:  [78-140] 129 (10/12 1400) Resp:  [17-37] 32 (10/12 1400) BP: (91-154)/(63-103) 145/84 (10/12 1400) SpO2:  [92 %-100 %] 93 % (10/12 1400) FiO2 (%):  [40 %-63 %] 60 % (10/12 0800) Weight:  [93.5 kg (206 lb 2.1 oz)] 93.5 kg (206 lb 2.1 oz) (10/12 0500) Last BM Date: 09/19/16  Intake/Output from previous day: 10/11 0701 - 10/12 0700 In: 540 [P.O.:140; IV Piggyback:400] Out: 2575 [Urine:2275; Drains:300] Intake/Output this shift: Total I/O In: -  Out: 325 [Urine:325]  Physical exam:  Gen.: No acute distress Chest: Coarse breath sounds throughout Heart: Tachycardic Abdomen: Soft, probably tender to palpation at the drain site, nondistended. Biliary drain in place draining normal-appearing bile.  Lab Results: CBC   Recent Labs  09/20/16 1421 09/21/16 0545  WBC 7.9 5.9  HGB 14.8 14.6  HCT 41.7 41.2  PLT 193 185   BMET  Recent Labs  09/20/16 1421 09/21/16 0545  NA 130* 133*  K 3.8 3.5  CL 98* 96*  CO2 23 26  GLUCOSE 113* 97  BUN 11 11  CREATININE 0.97 0.89  CALCIUM 8.1* 8.2*   PT/INR No results for input(s): LABPROT, INR in the last 72 hours. ABG No results for input(s): PHART, HCO3 in the last 72 hours.  Invalid input(s): PCO2, PO2  Studies/Results: Nm Hepatobiliary Liver Func  Result Date: 09/19/2016 CLINICAL DATA:  Acute cholecystitis. Right upper quadrant pain for 3 days. EXAM: NUCLEAR MEDICINE HEPATOBILIARY IMAGING TECHNIQUE: Sequential images of the abdomen were obtained out to 60 minutes following intravenous administration of radiopharmaceutical. RADIOPHARMACEUTICALS:  4.938 mCi Tc-51m Choletec IV. After 2 hours, 3 mg  of morphine was injected and an additional 2.152 mCi Tc-2m Choletec injected IV. COMPARISON:  Abdominal CT 07/20/2016 FINDINGS: Prompt uptake and biliary excretion of activity by the liver is seen. Gallbladder activity is not visualized over the course of 2 hours. There is biliary activity and activity passes into the small bowel. 3 mg morphine administered IV followed by additional Choletec injection. No gallbladder was visualized after an additional 30 minutes. IMPRESSION: No gallbladder visualization consistent with acute cholecystitis, even after the administration of IV morphine. Electronically Signed   By: Rubye Oaks M.D.   On: 09/19/2016 21:49   [place] Intraoperative  Result Date: 09/20/2016 CLINICAL DATA:  Ultrasound was provided for use by the ordering physician, and a technical charge was applied by the performing facility.  No radiologist interpretation/professional services rendered.   Ir Perc Cholecystostomy  Result Date: 09/20/2016 INDICATION: Patient admitted with acute cholecystitis complicated by development of multi lobar pneumonia. As such, patient deemed a poor operative candidate and request made for placement of a percutaneous cholecystostomy tube for infection source control. EXAM: ULTRASOUND AND FLUOROSCOPIC-GUIDED CHOLECYSTOSTOMY TUBE PLACEMENT COMPARISON:  CT scan of the abdomen and pelvis - 09/17/2016; nuclear medicine HIDA scan - 09/19/2016; chest CT - 09/18/2026 MEDICATIONS: The patient is currently admitted to the hospital and on intravenous antibiotics. Antibiotics were administered within an appropriate time frame prior to skin puncture. ANESTHESIA/SEDATION: Moderate (conscious) sedation was employed during this procedure. A total of Versed 2.5 mg and Fentanyl 75 mcg was administered intravenously. Moderate Sedation Time: 20 minutes. The patient's level of consciousness and vital signs were monitored continuously by  radiology nursing throughout the procedure under my  direct supervision. CONTRAST:  10mL ISOVUE-300 IOPAMIDOL (ISOVUE-300) INJECTION 61% - administered into the gallbladder fossa. FLUOROSCOPY TIME:  36 seconds (10 mGy) COMPLICATIONS: None immediate. PROCEDURE: Informed written consent was obtained from the patient's mother after a discussion of the risks, benefits and alternatives to treatment. Questions regarding the procedure were encouraged and answered. A timeout was performed prior to the initiation of the procedure. The right upper abdominal quadrant was prepped and draped in the usual sterile fashion, and a sterile drape was applied covering the operative field. Maximum barrier sterile technique with sterile gowns and gloves were used for the procedure. A timeout was performed prior to the initiation of the procedure. Local anesthesia was provided with 1% lidocaine with epinephrine. Ultrasound scanning of the right upper quadrant demonstrates a mildly distended gallbladder with minimal amount of gallbladder wall thickening. Utilizing a transhepatic approach, a 22 gauge needle was advanced into the gallbladder under direct ultrasound guidance. An ultrasound image was saved for documentation purposes. Appropriate intraluminal puncture was confirmed with the efflux of bile and advancement of an 0.018 wire into the gallbladder lumen. The needle was exchanged for an Accustick set. A small amount of contrast was injected to confirm appropriate intraluminal positioning. Over a Benson wire, a 10.2-French Cook cholecystomy tube was advanced into the gallbladder fossa, coiled and locked. Bile was aspirated and a small amount of contrast was injected as several post procedural spot fluoroscopic images were obtained in various obliquities. A small amount of bile was aspirated, capped and sent to the laboratory for analysis. The catheter was secured to the skin with suture, connected to a drainage bag and a dressing was placed. The patient tolerated the procedure well  without immediate post procedural complication. IMPRESSION: Successful ultrasound and fluoroscopic guided placement of a 10.2 French cholecystostomy tube. A small sample of aspirated bile was sent to the laboratory for analysis. Electronically Signed   By: Simonne ComeJohn  Watts M.D.   On: 09/20/2016 11:06   Dg Chest Port 1 View  Result Date: 09/20/2016 CLINICAL DATA:  Acute respiratory failure. EXAM: PORTABLE CHEST 1 VIEW COMPARISON:  Radiograph of September 19, 2016. FINDINGS: Stable cardiomediastinal silhouette. Hypoinflation of the lungs is noted. Increased bilateral perihilar and basilar interstitial densities are noted concerning for pulmonary edema. Minimal right pleural effusion is noted. Bony thorax is unremarkable. IMPRESSION: Increased bilateral perihilar and basilar interstitial densities are noted concerning for pulmonary edema. Hypoinflation of the lungs is noted. Electronically Signed   By: Lupita RaiderJames  Green Jr, M.D.   On: 09/20/2016 14:21    Anti-infectives: Anti-infectives    Start     Dose/Rate Route Frequency Ordered Stop   09/19/16 1000  azithromycin (ZITHROMAX) tablet 250 mg     250 mg Oral Daily 09/18/16 1424 09/23/16 0959   09/18/16 2200  piperacillin-tazobactam (ZOSYN) IVPB 3.375 g  Status:  Discontinued     3.375 g 12.5 mL/hr over 240 Minutes Intravenous Every 8 hours 09/18/16 1303 09/18/16 1317   09/18/16 1430  azithromycin (ZITHROMAX) tablet 500 mg     500 mg Oral Daily 09/18/16 1424 09/18/16 1537   09/18/16 1400  meropenem (MERREM) 1 g in sodium chloride 0.9 % 100 mL IVPB     1 g 200 mL/hr over 30 Minutes Intravenous Every 8 hours 09/18/16 1319     09/17/16 1800  piperacillin-tazobactam (ZOSYN) IVPB 3.375 g  Status:  Discontinued     3.375 g 12.5 mL/hr over 240 Minutes Intravenous Every 6 hours  09/17/16 1457 09/18/16 1303   09/17/16 0945  piperacillin-tazobactam (ZOSYN) IVPB 3.375 g     3.375 g 12.5 mL/hr over 240 Minutes Intravenous  Once 09/17/16 0939 09/17/16 1043       Assessment/Plan:  20 year old male status post percutaneous cholecystostomy tube placement for treatment of acute cholecystitis. His overall appearance is improving but he continues to have symptoms of fever and need for several option secondary to his pneumonia and bacteremia. Continue percutaneous drainage until he is able to have his gallbladder removed which would be until after the resolution of his pneumonia. General surgery will continue to follow.  Dorota Heinrichs T. Tonita Cong, MD, FACS  09/21/2016

## 2016-09-21 NOTE — Consult Note (Signed)
[facility] [place] [facility] Medicine Consultation      Assessment and Plan:  A: -Acute hypoxemic respiratory failure. RLL pneumonia, likely related to underlying acute cholecystitis. -Sepsis, secondary to above. -Right lower lobe atelectasis with elevated right sided diaphragm and RLL PNA - secondary to acute cholecystitis -History of autism. - Fevers - secondary to sepsis - Bacteremia - Klebsiella pneumonia  P: s/p ultrasound guided cholecystostomy tube placement by IR on 10/11 Broad-spectrum antibiotics (Meropenem and Azithro)  Azithromycin  Meropenem 10/10>  Bld Cx 10/8> Klebsiella pneumonia  Chole Tube Cx 10/11>  CTA chest with no PE on 10/9 Wean HFNC as tolerated Maintain O2 sats > 88%  Date: 09/21/2016  MRN# [id number] Joshua [name] Jun 21, 1996  Referring Physician: Dr. Elpidio Anis.   Joshua [name] is a 20 y.o. old male seen in consultation for chief complaint of:    Chief Complaint  Patient presents with  . Abdominal Pain    HPI:  Patient is a 20 year old male, he is accompanied by his mother. Patient presented to the hospital with increasing right upper quadrant abdominal pain. CT abdomen was performed on 09/17/16, suggestive of acute cholecystitis. Over the last 24 hours. Patient's oxygen saturations have dropped initially was on room air. Subsequently, this has been increased to 6 L via nasal cannula. The patient complains of mild dyspnea, however, he does not look to be in any respiratory distress.  On review of the patient's imaging. There does appear to be an elevated right-sided diaphragm, possible new right sided infiltrates, when combined with the presence of fever. This may be suggestive of pneumonia.  SUBJ: Still with temp elevations over night,controlling with tylenol and ibuprofen   PMHX:   Past Medical History:  Diagnosis Date  . Autism   . Irritable bowel syndrome    Surgical Hx:  Past Surgical History:  Procedure Laterality Date  . EYE SURGERY    .  IR GENERIC HISTORICAL  09/20/2016   IR PERC CHOLECYSTOSTOMY 09/20/2016 [name], MD [facility]-INTERV RAD   Family Hx:  History reviewed. No pertinent family history. Social Hx:   Social History  Substance Use Topics  . Smoking status: Never Smoker  . Smokeless tobacco: Never Used  . Alcohol use Not on file   Medication:       Allergies:  Review of patient's allergies indicates no known allergies.  Review of Systems: Gen: admits  fever, sweats, chills Cvc:  No dizziness, chest pain. Resp:   Denies cough or sputum production Gi: Denies swallowing difficulty,  Admits nausea, admits abdominal pain Gu:  Denies bladder incontinence, burning urine Ext:   No Joint pain, stiffness. Skin: No skin rash,  hives  Endoc:  No polyuria, polydipsia. Psych: No depression, insomnia. Other:  All other systems were reviewed with the patient and were negative other that what is mentioned in the HPI.   Physical Examination:   VS: BP 118/75   Pulse (!) 105   Temp 98.2 F (36.8 C) (Oral)   Resp (!) 30   Ht 5' (1.524 m)   Wt 206 lb 2.1 oz (93.5 kg)   SpO2 99%   BMI 40.26 kg/m   General Appearance: No distress  Neuro:without focal findings,  speech normal,  HEENT: PERRLA, EOM intact.   Pulmonary: normal breath sounds, No wheezing. Decreased air entry in both lung bases. tachypnea CardiovascularNormal S1,S2.  No m/r/g.   Abdomen: Benign, Soft, moderate RUQ tenderness Renal:  No costovertebral tenderness  GU:  No performed at this time. Endoc: No  evident thyromegaly, no signs of acromegaly. Skin:   warm, no rashes, no ecchymosis  Extremities: normal, no cyanosis, clubbing.  Other findings:    LABORATORY PANEL:   CBC  Recent Labs Lab 09/21/16 0545  WBC 5.9  HGB 14.6  HCT 41.2  PLT 185   ------------------------------------------------------------------------------------------------------------------  Chemistries   Recent Labs Lab 09/20/16 1421 09/21/16 0545  NA 130* 133*    K 3.8 3.5  CL 98* 96*  CO2 23 26  GLUCOSE 113* 97  BUN 11 11  CREATININE 0.97 0.89  CALCIUM 8.1* 8.2*  AST 35  --   ALT 31  --   ALKPHOS 62  --   BILITOT 1.3*  --    ------------------------------------------------------------------------------------------------------------------  Cardiac Enzymes  Recent Labs Lab 09/17/16 0918  TROPONINI <0.03   ------------------------------------------------------------  RADIOLOGY:  Nm Hepatobiliary Liver Func  Result Date: 09/19/2016 CLINICAL DATA:  Acute cholecystitis. Right upper quadrant pain for 3 days. EXAM: NUCLEAR MEDICINE HEPATOBILIARY IMAGING TECHNIQUE: Sequential images of the abdomen were obtained out to 60 minutes following intravenous administration of radiopharmaceutical. RADIOPHARMACEUTICALS:  4.938 mCi Tc-6275m Choletec IV. After 2 hours, 3 mg of morphine was injected and an additional 2.152 mCi Tc-7375m Choletec injected IV. COMPARISON:  Abdominal CT 07/20/2016 FINDINGS: Prompt uptake and biliary excretion of activity by the liver is seen. Gallbladder activity is not visualized over the course of 2 hours. There is biliary activity and activity passes into the small bowel. 3 mg morphine administered IV followed by additional Choletec injection. No gallbladder was visualized after an additional 30 minutes. IMPRESSION: No gallbladder visualization consistent with acute cholecystitis, even after the administration of IV morphine. Electronically Signed   By: Rubye OaksMelanie  Ehinger M.D.   On: 09/19/2016 21:49   [place] Intraoperative  Result Date: 09/20/2016 CLINICAL DATA:  Ultrasound was provided for use by the ordering physician, and a technical charge was applied by the performing facility.  No radiologist interpretation/professional services rendered.   Ir Perc Cholecystostomy  Result Date: 09/20/2016 INDICATION: Patient admitted with acute cholecystitis complicated by development of multi lobar pneumonia. As such, patient deemed a poor  operative candidate and request made for placement of a percutaneous cholecystostomy tube for infection source control. EXAM: ULTRASOUND AND FLUOROSCOPIC-GUIDED CHOLECYSTOSTOMY TUBE PLACEMENT COMPARISON:  CT scan of the abdomen and pelvis - 09/17/2016; nuclear medicine HIDA scan - 09/19/2016; chest CT - 09/18/2026 MEDICATIONS: The patient is currently admitted to the hospital and on intravenous antibiotics. Antibiotics were administered within an appropriate time frame prior to skin puncture. ANESTHESIA/SEDATION: Moderate (conscious) sedation was employed during this procedure. A total of Versed 2.5 mg and Fentanyl 75 mcg was administered intravenously. Moderate Sedation Time: 20 minutes. The patient's level of consciousness and vital signs were monitored continuously by radiology nursing throughout the procedure under my direct supervision. CONTRAST:  10mL ISOVUE-300 IOPAMIDOL (ISOVUE-300) INJECTION 61% - administered into the gallbladder fossa. FLUOROSCOPY TIME:  36 seconds (10 mGy) COMPLICATIONS: None immediate. PROCEDURE: Informed written consent was obtained from the patient's mother after a discussion of the risks, benefits and alternatives to treatment. Questions regarding the procedure were encouraged and answered. A timeout was performed prior to the initiation of the procedure. The right upper abdominal quadrant was prepped and draped in the usual sterile fashion, and a sterile drape was applied covering the operative field. Maximum barrier sterile technique with sterile gowns and gloves were used for the procedure. A timeout was performed prior to the initiation of the procedure. Local anesthesia was provided with 1% lidocaine with  epinephrine. Ultrasound scanning of the right upper quadrant demonstrates a mildly distended gallbladder with minimal amount of gallbladder wall thickening. Utilizing a transhepatic approach, a 22 gauge needle was advanced into the gallbladder under direct ultrasound guidance.  An ultrasound image was saved for documentation purposes. Appropriate intraluminal puncture was confirmed with the efflux of bile and advancement of an 0.018 wire into the gallbladder lumen. The needle was exchanged for an Accustick set. A small amount of contrast was injected to confirm appropriate intraluminal positioning. Over a Benson wire, a 10.2-French Cook cholecystomy tube was advanced into the gallbladder fossa, coiled and locked. Bile was aspirated and a small amount of contrast was injected as several post procedural spot fluoroscopic images were obtained in various obliquities. A small amount of bile was aspirated, capped and sent to the laboratory for analysis. The catheter was secured to the skin with suture, connected to a drainage bag and a dressing was placed. The patient tolerated the procedure well without immediate post procedural complication. IMPRESSION: Successful ultrasound and fluoroscopic guided placement of a 10.2 French cholecystostomy tube. A small sample of aspirated bile was sent to the laboratory for analysis. Electronically Signed   By: [name] M.D.   On: 09/20/2016 11:06   Dg Chest Port 1 View  Result Date: 09/20/2016 CLINICAL DATA:  Acute respiratory failure. EXAM: PORTABLE CHEST 1 VIEW COMPARISON:  Radiograph of September 19, 2016. FINDINGS: Stable cardiomediastinal silhouette. Hypoinflation of the lungs is noted. Increased bilateral perihilar and basilar interstitial densities are noted concerning for pulmonary edema. Minimal right pleural effusion is noted. Bony thorax is unremarkable. IMPRESSION: Increased bilateral perihilar and basilar interstitial densities are noted concerning for pulmonary edema. Hypoinflation of the lungs is noted. Electronically Signed   By: Lupita Raider, M.D.   On: 09/20/2016 14:21     Thank  you for the consultation and for allowing [facility] to assist in the care of your patient. Our recommendations are noted  above.  Please contact us if we can be of further service.  CCM Time - 35 mins  Stephanie Acre, MD [facility] Pager [phone] (please enter 7-digits) On Call Pager - [phone] (please enter 7-digits)   09/21/2016

## 2016-09-21 NOTE — Progress Notes (Signed)
Patient's heart rate and respiratory rate and effort elevated during middle of shift, temperature began to rise (99's after being in the 98s). Patient was in pain so RN gave PO pain medication per parameters. Vital signs still elevated, pain still present and patient now reporting anxiety, RN gave prn ativan and then IV dilaudid after observing patient after ativan dose. When patient reported pain and anxiety relieved, RN rechecked temperature and patient 103.3 F oral. RN notified Bincy NP, who assessed patient and patient's chart (cultures, current medications, etc...) again. Bincy NP ordered ibuprofen prn for fever reduction (patient already received 4 g of tyelnol PO scheduled per day). After reassessment post ibuprofen administration, patient's temperature 99.7 F oral. Team will continue to monitor.

## 2016-09-21 NOTE — Progress Notes (Signed)
Pt last temp. is 98.7 F.  HR (110s) and Respiration (30s) still elevated.  NP notified.  No new orders at this time.  Continue to monitor patient.

## 2016-09-21 NOTE — Progress Notes (Signed)
Pt temperature elevated to 102.7 F at 1428, acetaminophen given at 1431.  Pt also has elevated heart rate (130s) and respiration (30-40s).  Pt stated he is feeling a little anxious.  Ativan and Dilaudid given.  Temperature retaken at 1524, temperature is 103.4 F.  Provider notified.  New order for IV acetaminophen.  Will continue to monitor pt.

## 2016-09-21 NOTE — Progress Notes (Signed)
Chaplain rounded the unit to provide a compassionate presence and support to the patient and family. The patient appeared to be sleeping and silent prayer was said.  Jefm PettyChaplain Aasiyah Auerbach [phone]

## 2016-09-21 NOTE — Progress Notes (Signed)
Pharmacy Antibiotic Note  Joshua [name] is a 20 y.o. male admitted on 09/17/2016 with cholecystitis and Klebsiella bacteremia. Patient admitted to ICU for ARDS. Initially on Zosyn but will escalate to meropenem to cover for possible ESBL Klebsiella pneumoniae due to patient's deteriorating clinical condition; sensitivities now sowing sensitivity to Ampicillin-Sulbactam/ESBL negative. Azithromycin added for atypical coverage for PNA.  Plan: Ampicillin/Sulbactam 3g IV Q6hr.   Height: 5' (152.4 cm) Weight: 206 lb 2.1 oz (93.5 kg) IBW/kg (Calculated) : 50  Temp (24hrs), Avg:100.7 F (38.2 C), Min:98.2 F (36.8 C), Max:103.4 F (39.7 C)   Recent Labs Lab 09/17/16 0918 09/17/16 0943 09/18/16 0601 09/19/16 0433 09/20/16 1115 09/20/16 1421 09/21/16 0545  WBC 21.9*  --  21.5* 15.2*  --  7.9 5.9  CREATININE 1.02  --  1.10 1.00  --  0.97 0.89  LATICACIDVEN  --  1.4  --   --  1.0 0.8  --     Estimated Creatinine Clearance: 125.2 mL/min (by C-G formula based on SCr of 0.89 mg/dL).    No Known Allergies  Antimicrobials this admission: Zosyn 10/8 >> 10/9 Meropenem 10/9 >> 10/12 Azithromycin 10/9 >> Ampicillin/Sulbactam 10/12 >>   Dose adjustments this admission:   Microbiology results: 10/8 BCx: K pneumoniae pan-sensitive except ampicillin 10/8 MRSA PCR: negative  Pharmacy will continue to monitor and adjust per consult.   Sarahann Horrell L 09/21/2016 7:55 PM

## 2016-09-22 DIAGNOSIS — J156 Pneumonia due to other aerobic Gram-negative bacteria: Secondary | ICD-10-CM

## 2016-09-22 LAB — CBC
HCT: 42.3 % (ref 40.0–52.0)
Hemoglobin: 14.8 g/dL (ref 13.0–18.0)
MCH: 30.3 pg (ref 26.0–34.0)
MCHC: 35 g/dL (ref 32.0–36.0)
MCV: 86.6 fL (ref 80.0–100.0)
PLATELETS: 176 10*3/uL (ref 150–440)
RBC: 4.89 MIL/uL (ref 4.40–5.90)
RDW: 12.8 % (ref 11.5–14.5)
WBC: 6.3 10*3/uL (ref 3.8–10.6)

## 2016-09-22 LAB — BASIC METABOLIC PANEL
Anion gap: 9 (ref 5–15)
BUN: 11 mg/dL (ref 6–20)
CO2: 32 mmol/L (ref 22–32)
Calcium: 8.2 mg/dL — ABNORMAL LOW (ref 8.9–10.3)
Chloride: 93 mmol/L — ABNORMAL LOW (ref 101–111)
Creatinine, Ser: 1.16 mg/dL (ref 0.61–1.24)
GFR calc Af Amer: >60 mL/min (ref >60)
GFR calc non Af Amer: >60 mL/min (ref >60)
Glucose, Bld: 103 mg/dL — ABNORMAL HIGH (ref 65–99)
Potassium: 3.7 mmol/L (ref 3.5–5.1)
Sodium: 134 mmol/L — ABNORMAL LOW (ref 135–145)

## 2016-09-22 MED ORDER — DEXMEDETOMIDINE HCL IN NACL 200 MCG/50ML IV SOLN: 0.4000 ug/kg/h | INTRAVENOUS | Status: DC

## 2016-09-22 MED ORDER — SODIUM CHLORIDE 0.9 % IV SOLN
1.0000 g | Freq: Three times a day (TID) | INTRAVENOUS | Status: DC
  Administered 2016-09-22: 1 g via INTRAVENOUS
  Filled 2016-09-22 (×2): qty 1

## 2016-09-22 MED ORDER — ENOXAPARIN SODIUM 40 MG/0.4ML ~~LOC~~ SOLN
40.0000 mg | SUBCUTANEOUS | Status: DC
  Administered 2016-09-22 – 2016-09-24 (×3): 40 mg via SUBCUTANEOUS
  Filled 2016-09-22 (×3): qty 0.4

## 2016-09-22 MED ORDER — CIPROFLOXACIN IN D5W 400 MG/200ML IV SOLN
400.0000 mg | Freq: Two times a day (BID) | INTRAVENOUS | Status: DC
  Administered 2016-09-22 – 2016-09-25 (×7): 400 mg via INTRAVENOUS
  Filled 2016-09-22 (×7): qty 200

## 2016-09-22 MED ORDER — SODIUM CHLORIDE 0.9 % IV SOLN
3.0000 g | Freq: Four times a day (QID) | INTRAVENOUS | Status: DC
  Filled 2016-09-22 (×2): qty 3

## 2016-09-22 MED ORDER — FUROSEMIDE 10 MG/ML IJ SOLN
40.0000 mg | Freq: Once | INTRAMUSCULAR | Status: AC
  Administered 2016-09-22: 40 mg via INTRAVENOUS
  Filled 2016-09-22: qty 4

## 2016-09-22 MED ORDER — METRONIDAZOLE 500 MG PO TABS
500.0000 mg | ORAL_TABLET | Freq: Three times a day (TID) | ORAL | Status: DC
  Administered 2016-09-22 – 2016-09-25 (×10): 500 mg via ORAL
  Filled 2016-09-22 (×10): qty 1

## 2016-09-22 MED ORDER — DEXMEDETOMIDINE HCL IN NACL 200 MCG/50ML IV SOLN: 0.0000 ug/kg/h | INTRAVENOUS | Status: DC

## 2016-09-22 MED ORDER — IPRATROPIUM-ALBUTEROL 0.5-2.5 (3) MG/3ML IN SOLN: 3.0000 mL | RESPIRATORY_TRACT | Status: DC | PRN

## 2016-09-22 MED ORDER — DEXMEDETOMIDINE HCL IN NACL 400 MCG/100ML IV SOLN
0.0000 ug/kg/h | INTRAVENOUS | Status: DC
  Administered 2016-09-22: 0.4 ug/kg/h via INTRAVENOUS
  Filled 2016-09-22: qty 100

## 2016-09-22 NOTE — Progress Notes (Signed)
Dr. Tonita CongWoodham at bedside assessing pt and updating family on the patient, patient sitting up in the chair, on 45% venturi mask oxygen, vs wnl as charted. Precedex on hold at this time, no ss of anxiety noted. New order obtained for regular diet. Will monitor closely.

## 2016-09-22 NOTE — Progress Notes (Signed)
eLink Physician-Brief Progress Note Patient Name: Ronnie DerbyCody N Bassinger DOB: 13-Jul-1996 MRN: [id number]   Date of Service  09/22/2016  HPI/Events of Note  Severe agitation despite prn ativan, O2 saturation dropping with HFNC, has mild nose bleed Autism.  Discussed with mother via camera who feels medication to help with agitation is necessary at this point.    eICU Interventions  Add precedex NP to assess bedside as well     Intervention Category Major Interventions: Delirium, psychosis, severe agitation - evaluation and management  Max FickleDouglas Diamonique Ruedas 09/22/2016, 12:54 AM

## 2016-09-22 NOTE — Progress Notes (Signed)
Patient is sitting up in the chair for most of the day, no ss of distress noted all day. VS wnl all day. Oxygen supply switched from 35% venturi mask to [place] 4l, pt has oxygen sats in the high 90s. Family at bedside very supportive of the care. Dr Sampson GoonFitzgerald has seen patient and updated medications. Will monitor closely.

## 2016-09-22 NOTE — Consult Note (Addendum)
[facility] Infectious Disease     Reason for Consult: Fevers, bacteremia, cholecystitis    Referring Physician: Carlynn Herald,  Date of Admission:  09/17/2016   Principal Problem:   Acute calculous cholecystitis Active Problems:   Acute cholecystitis   Pneumothorax   HPI: Joshua [name] is a 20 y.o. male admitted with abd pain and fevers chills. On admit had wbc 2, USS showed distended GB and cholecystitis. Started on IV abx  And was to go to OR but developed acute resp distress, with possible PNA.  [place] turned + with Klebsiella.  He developed high fevers and had a perc chole tube placed 10/11. CX + Klebsiella PNA. He has had decreased wbc but fevers have persisted.  Today with his mother in room, he appears to be improving. Ate a little, less pain.   Past Medical History:  Diagnosis Date  . Autism   . Irritable bowel syndrome    Past Surgical History:  Procedure Laterality Date  . EYE SURGERY    . IR GENERIC HISTORICAL  09/20/2016   IR PERC CHOLECYSTOSTOMY 09/20/2016 Sandi Mariscal, MD [facility]-INTERV RAD   Social History  Substance Use Topics  . Smoking status: Never Smoker  . Smokeless tobacco: Never Used  . Alcohol use Not on file   History reviewed. [name] pertinent family history.  Allergies: [name] Known Allergies  Current antibiotics: Antibiotics Given (last 72 hours)    Date/Time Action Medication Dose Rate   09/19/16 1429 Given   meropenem (MERREM) 1 g in sodium chloride 0.9 % 100 mL IVPB 1 g 200 mL/hr   09/19/16 2150 Given   meropenem (MERREM) 1 g in sodium chloride 0.9 % 100 mL IVPB 1 g 200 mL/hr   09/20/16 0533 Given   meropenem (MERREM) 1 g in sodium chloride 0.9 % 100 mL IVPB 1 g 200 mL/hr   09/20/16 1101 Given   azithromycin (ZITHROMAX) tablet 250 mg 250 mg    09/20/16 1308 Given   meropenem (MERREM) 1 g in sodium chloride 0.9 % 100 mL IVPB 1 g 200 mL/hr   09/20/16 2145 Given   meropenem (MERREM) 1 g in sodium chloride 0.9 % 100 mL IVPB 1 g 200 mL/hr   09/21/16 0505  Given   meropenem (MERREM) 1 g in sodium chloride 0.9 % 100 mL IVPB 1 g 200 mL/hr   09/21/16 0916 Given   azithromycin (ZITHROMAX) tablet 250 mg 250 mg    09/21/16 1318 Given   meropenem (MERREM) 1 g in sodium chloride 0.9 % 100 mL IVPB 1 g 200 mL/hr   09/21/16 1810 Given   Ampicillin-Sulbactam (UNASYN) 3 g in sodium chloride 0.9 % 100 mL IVPB 3 g 100 mL/hr   09/22/16 0014 Given   Ampicillin-Sulbactam (UNASYN) 3 g in sodium chloride 0.9 % 100 mL IVPB 3 g 100 mL/hr   09/22/16 0522 Given   Ampicillin-Sulbactam (UNASYN) 3 g in sodium chloride 0.9 % 100 mL IVPB 3 g 100 mL/hr   09/22/16 0920 Given   azithromycin (ZITHROMAX) tablet 250 mg 250 mg    09/22/16 1146 Given   Ampicillin-Sulbactam (UNASYN) 3 g in sodium chloride 0.9 % 100 mL IVPB 3 g 100 mL/hr   09/22/16 1252 Given   meropenem (MERREM) 1 g in sodium chloride 0.9 % 100 mL IVPB 1 g 200 mL/hr      MEDICATIONS: . famotidine  20 mg Oral BID  . heparin  5,000 Units Subcutaneous Q8H  . meropenem (MERREM) IV  1 g Intravenous  Q8H    Review of Systems - 11 systems reviewed and negative per HPI   OBJECTIVE: Temp:  [98.5 F (36.9 C)-103.4 F (39.7 C)] 98.9 F (37.2 C) (10/13 1100) Pulse Rate:  [96-141] 108 (10/13 1000) Resp:  [16-41] 31 (10/13 1000) BP: (96-180)/(57-126) 108/57 (10/13 1000) SpO2:  [88 %-100 %] 90 % (10/13 1000) FiO2 (%):  [35 %-55 %] 45 % (10/13 1100) Weight:  [96.5 kg (212 lb 11.9 oz)] 96.5 kg (212 lb 11.9 oz) (10/13 0500) Physical Exam  Constitutional: He is oriented to person, place, and time. He appears dishelved, obese HENT: anicteric Mouth/Throat: Oropharynx is clear and moist. [name] oropharyngeal exudate.  Cardiovascular: Normal rate, regular rhythm and normal heart sounds.  Pulmonary/Chest: bil rhonhci Abdominal: Soft. Bowel sounds are normal. He exhibits [name] distension. There is mild tenderness. Perc chole tube with greenish bloody drainage Lymphadenopathy: He has [name] cervical adenopathy.  Neurological:  He is alert and oriented to person, place, and time.  Skin: Skin is warm and dry. [name] rash noted. [name] erythema.  Psychiatric: He has a normal mood and affect. His behavior is normal.     LABS: Results for orders placed or performed during the hospital encounter of 09/17/16 (from the past 48 hour(s))  CBC with Differential/Platelet     Status: Abnormal   Collection Time: 09/20/16  2:21 PM  Result Value Ref Range   WBC 7.9 3.8 - 10.6 K/uL   RBC 4.89 4.40 - 5.90 MIL/uL   Hemoglobin 14.8 13.0 - 18.0 g/dL   HCT 41.7 40.0 - 52.0 %   MCV 85.3 80.0 - 100.0 fL   MCH 30.2 26.0 - 34.0 pg   MCHC 35.4 32.0 - 36.0 g/dL   RDW 12.5 11.5 - 14.5 %   Platelets 193 150 - 440 K/uL   Neutrophils Relative % 87 %   Neutro Abs 6.8 (H) 1.4 - 6.5 K/uL   Lymphocytes Relative 5 %   Lymphs Abs 0.4 (L) 1.0 - 3.6 K/uL   Monocytes Relative 8 %   Monocytes Absolute 0.6 0.2 - 1.0 K/uL   Eosinophils Relative 0 %   Eosinophils Absolute 0.0 0 - 0.7 K/uL   Basophils Relative 0 %   Basophils Absolute 0.0 0 - 0.1 K/uL  Comprehensive metabolic panel     Status: Abnormal   Collection Time: 09/20/16  2:21 PM  Result Value Ref Range   Sodium 130 (L) 135 - 145 mmol/L   Potassium 3.8 3.5 - 5.1 mmol/L   Chloride 98 (L) 101 - 111 mmol/L   CO2 23 22 - 32 mmol/L   Glucose, Bld 113 (H) 65 - 99 mg/dL   BUN 11 6 - 20 mg/dL   Creatinine, Ser 0.97 0.61 - 1.24 mg/dL   Calcium 8.1 (L) 8.9 - 10.3 mg/dL   Total Protein 6.2 (L) 6.5 - 8.1 g/dL   Albumin 3.0 (L) 3.5 - 5.0 g/dL   AST 35 15 - 41 U/L   ALT 31 17 - 63 U/L   Alkaline Phosphatase 62 38 - 126 U/L   Total Bilirubin 1.3 (H) 0.3 - 1.2 mg/dL   GFR calc non Af Amer >60 >60 mL/min   GFR calc Af Amer >60 >60 mL/min    Comment: (NOTE) The eGFR has been calculated using the CKD EPI equation. This calculation has not been validated in all clinical situations. eGFR's persistently <60 mL/min signify possible Chronic Kidney Disease.    Anion gap 9 5 - 15  Lactic acid, plasma  Status: None   Collection Time: 09/20/16  2:21 PM  Result Value Ref Range   Lactic Acid, Venous 0.8 0.5 - 1.9 mmol/L  Basic metabolic panel     Status: Abnormal   Collection Time: 09/21/16  5:45 AM  Result Value Ref Range   Sodium 133 (L) 135 - 145 mmol/L   Potassium 3.5 3.5 - 5.1 mmol/L   Chloride 96 (L) 101 - 111 mmol/L   CO2 26 22 - 32 mmol/L   Glucose, Bld 97 65 - 99 mg/dL   BUN 11 6 - 20 mg/dL   Creatinine, Ser 0.89 0.61 - 1.24 mg/dL   Calcium 8.2 (L) 8.9 - 10.3 mg/dL   GFR calc non Af Amer >60 >60 mL/min   GFR calc Af Amer >60 >60 mL/min    Comment: (NOTE) The eGFR has been calculated using the CKD EPI equation. This calculation has not been validated in all clinical situations. eGFR's persistently <60 mL/min signify possible Chronic Kidney Disease.    Anion gap 11 5 - 15  CBC with Differential/Platelet     Status: Abnormal   Collection Time: 09/21/16  5:45 AM  Result Value Ref Range   WBC 5.9 3.8 - 10.6 K/uL   RBC 4.85 4.40 - 5.90 MIL/uL   Hemoglobin 14.6 13.0 - 18.0 g/dL   HCT 41.2 40.0 - 52.0 %   MCV 85.1 80.0 - 100.0 fL   MCH 30.1 26.0 - 34.0 pg   MCHC 35.4 32.0 - 36.0 g/dL   RDW 12.7 11.5 - 14.5 %   Platelets 185 150 - 440 K/uL   Neutrophils Relative % 76 %   Neutro Abs 4.5 1.4 - 6.5 K/uL   Lymphocytes Relative 12 %   Lymphs Abs 0.7 (L) 1.0 - 3.6 K/uL   Monocytes Relative 12 %   Monocytes Absolute 0.7 0.2 - 1.0 K/uL   Eosinophils Relative 0 %   Eosinophils Absolute 0.0 0 - 0.7 K/uL   Basophils Relative 0 %   Basophils Absolute 0.0 0 - 0.1 K/uL  Basic metabolic panel     Status: Abnormal   Collection Time: 09/22/16  5:22 AM  Result Value Ref Range   Sodium 134 (L) 135 - 145 mmol/L   Potassium 3.7 3.5 - 5.1 mmol/L   Chloride 93 (L) 101 - 111 mmol/L   CO2 32 22 - 32 mmol/L   Glucose, Bld 103 (H) 65 - 99 mg/dL   BUN 11 6 - 20 mg/dL   Creatinine, Ser 1.16 0.61 - 1.24 mg/dL   Calcium 8.2 (L) 8.9 - 10.3 mg/dL   GFR calc non Af Amer >60 >60 mL/min   GFR  calc Af Amer >60 >60 mL/min    Comment: (NOTE) The eGFR has been calculated using the CKD EPI equation. This calculation has not been validated in all clinical situations. eGFR's persistently <60 mL/min signify possible Chronic Kidney Disease.    Anion gap 9 5 - 15  CBC     Status: None   Collection Time: 09/22/16  5:22 AM  Result Value Ref Range   WBC 6.3 3.8 - 10.6 K/uL   RBC 4.89 4.40 - 5.90 MIL/uL   Hemoglobin 14.8 13.0 - 18.0 g/dL   HCT 42.3 40.0 - 52.0 %   MCV 86.6 80.0 - 100.0 fL   MCH 30.3 26.0 - 34.0 pg   MCHC 35.0 32.0 - 36.0 g/dL   RDW 12.8 11.5 - 14.5 %   Platelets 176 150 - 440 K/uL  [name] components found for: ESR, C REACTIVE PROTEIN MICRO: Recent Results (from the past 720 hour(s))  Blood Culture (routine x 2)     Status: Abnormal   Collection Time: 09/17/16  9:44 AM  Result Value Ref Range Status   Specimen Description BLOOD LEFT ANTECUBITAL  Final   Special Requests BOTTLES DRAWN AEROBIC AND ANAEROBIC  10CC  Final   Culture  Setup Time   Final    GRAM NEGATIVE RODS IN BOTH AEROBIC AND ANAEROBIC BOTTLES CRITICAL RESULT CALLED TO, READ BACK BY AND VERIFIED WITH: MATT MCBANE AT 0302 ON 09/18/16 RWW CONFIRMED BY [facility] Performed at [place] (A)  Final   Report Status 09/20/2016 FINAL  Final   Organism ID, Bacteria KLEBSIELLA PNEUMONIAE  Final      Susceptibility   Klebsiella pneumoniae - MIC*    AMPICILLIN >=32 RESISTANT Resistant     CEFAZOLIN <=4 SENSITIVE Sensitive     CEFEPIME <=1 SENSITIVE Sensitive     CEFTAZIDIME <=1 SENSITIVE Sensitive     CEFTRIAXONE <=1 SENSITIVE Sensitive     CIPROFLOXACIN <=0.25 SENSITIVE Sensitive     GENTAMICIN <=1 SENSITIVE Sensitive     IMIPENEM <=0.25 SENSITIVE Sensitive     TRIMETH/SULFA <=20 SENSITIVE Sensitive     AMPICILLIN/SULBACTAM 4 SENSITIVE Sensitive     PIP/TAZO <=4 SENSITIVE Sensitive     Extended ESBL NEGATIVE Sensitive     * KLEBSIELLA PNEUMONIAE  Blood Culture  (routine x 2)     Status: Abnormal   Collection Time: 09/17/16  9:44 AM  Result Value Ref Range Status   Specimen Description BLOOD LEFT FOREARM  Final   Special Requests BOTTLES DRAWN AEROBIC AND ANAEROBIC  10CC  Final   Culture  Setup Time   Final    GRAM NEGATIVE RODS ANAEROBIC BOTTLE ONLY CRITICAL VALUE NOTED.  VALUE IS CONSISTENT WITH PREVIOUSLY REPORTED AND CALLED VALUE. CONFIRMED BY [facility]    Culture (A)  Final    KLEBSIELLA PNEUMONIAE SUSCEPTIBILITIES PERFORMED ON PREVIOUS CULTURE WITHIN THE LAST 5 DAYS. Performed at [facility]    Report Status 09/20/2016 FINAL  Final  Blood Culture ID Panel (Reflexed)     Status: Abnormal   Collection Time: 09/17/16  9:44 AM  Result Value Ref Range Status   Enterococcus species NOT DETECTED NOT DETECTED Final   Listeria monocytogenes NOT DETECTED NOT DETECTED Final   Staphylococcus species NOT DETECTED NOT DETECTED Final   Staphylococcus aureus NOT DETECTED NOT DETECTED Final   Streptococcus species NOT DETECTED NOT DETECTED Final   Streptococcus agalactiae NOT DETECTED NOT DETECTED Final   Streptococcus pneumoniae NOT DETECTED NOT DETECTED Final   Streptococcus pyogenes NOT DETECTED NOT DETECTED Final   Acinetobacter baumannii NOT DETECTED NOT DETECTED Final   Enterobacteriaceae species DETECTED (A) NOT DETECTED Final    Comment: CRITICAL RESULT CALLED TO, READ BACK BY AND VERIFIED WITH: MATT MCBANE AT 0302 ON 09/18/16 RWW    Enterobacter cloacae complex NOT DETECTED NOT DETECTED Final   Escherichia coli NOT DETECTED NOT DETECTED Final   Klebsiella oxytoca NOT DETECTED NOT DETECTED Final   Klebsiella pneumoniae DETECTED (A) NOT DETECTED Final    Comment: CRITICAL RESULT CALLED TO, READ BACK BY AND VERIFIED WITH: MATT MCBANE AT 0302 ON 09/18/16 RWW    Proteus species NOT DETECTED NOT DETECTED Final   Serratia marcescens NOT DETECTED NOT DETECTED Final   Carbapenem resistance NOT DETECTED NOT DETECTED Final   Haemophilus  influenzae NOT DETECTED  NOT DETECTED Final   Neisseria meningitidis NOT DETECTED NOT DETECTED Final   Pseudomonas aeruginosa NOT DETECTED NOT DETECTED Final   Candida albicans NOT DETECTED NOT DETECTED Final   Candida glabrata NOT DETECTED NOT DETECTED Final   Candida krusei NOT DETECTED NOT DETECTED Final   Candida parapsilosis NOT DETECTED NOT DETECTED Final   Candida tropicalis NOT DETECTED NOT DETECTED Final  MRSA PCR Screening     Status: None   Collection Time: 09/17/16  7:35 PM  Result Value Ref Range Status   MRSA by PCR NEGATIVE NEGATIVE Final    Comment:        The GeneXpert MRSA Assay (FDA approved for NASAL specimens only), is one component of a comprehensive MRSA colonization surveillance program. It is not intended to diagnose MRSA infection nor to guide or monitor treatment for MRSA infections.   Aerobic/Anaerobic Culture (surgical/deep wound)     Status: None (Preliminary result)   Collection Time: 09/20/16 10:15 AM  Result Value Ref Range Status   Specimen Description BILE  Final   Special Requests CHOLECYSTOSTOMY  Final   Gram Stain   Final    RARE WBC PRESENT, PREDOMINANTLY PMN FEW GRAM NEGATIVE RODS Performed at [facility]    Culture   Final    ABUNDANT KLEBSIELLA PNEUMONIAE [name] ANAEROBES ISOLATED; CULTURE IN PROGRESS FOR 5 DAYS    Report Status PENDING  Incomplete   Organism ID, Bacteria KLEBSIELLA PNEUMONIAE  Final      Susceptibility   Klebsiella pneumoniae - MIC*    AMPICILLIN >=32 RESISTANT Resistant     CEFAZOLIN <=4 SENSITIVE Sensitive     CEFEPIME <=1 SENSITIVE Sensitive     CEFTAZIDIME <=1 SENSITIVE Sensitive     CEFTRIAXONE <=1 SENSITIVE Sensitive     CIPROFLOXACIN <=0.25 SENSITIVE Sensitive     GENTAMICIN <=1 SENSITIVE Sensitive     IMIPENEM 0.5 SENSITIVE Sensitive     TRIMETH/SULFA <=20 SENSITIVE Sensitive     AMPICILLIN/SULBACTAM 4 SENSITIVE Sensitive     PIP/TAZO <=4 SENSITIVE Sensitive     Extended ESBL NEGATIVE Sensitive      * ABUNDANT KLEBSIELLA PNEUMONIAE    IMAGING: Dg Chest 1 View  Result Date: 09/18/2016 CLINICAL DATA:  Shortness of breath and right upper abdominal pain. EXAM: CHEST 1 VIEW COMPARISON:  09/17/2016 FINDINGS: Lung volumes are lower compared to the prior study with atelectasis present bilaterally, especially in the right perihilar lung. [name] overt edema or pleural effusion. The heart size and mediastinal contours are normal. IMPRESSION: Lower lung volumes with more prominent atelectasis bilaterally, most prominently in the right perihilar region. Electronically Signed   By: Aletta Edouard M.D.   On: 09/18/2016 12:13   Dg Chest 2 View  Result Date: 09/17/2016 CLINICAL DATA:  Pt has a fever and is having RUQ pain for a couple of days now. Nonsmoker. [name] heart or lung problems. EXAM: CHEST  2 VIEW COMPARISON:  None. FINDINGS: The heart size and mediastinal contours are within normal limits. Both lungs are clear. [name] pleural effusion or pneumothorax. The visualized skeletal structures are unremarkable. IMPRESSION: [name] active cardiopulmonary disease. Electronically Signed   By: Lajean Manes M.D.   On: 09/17/2016 11:53   Ct Angio Chest Pe W Or Wo Contrast  Result Date: 09/18/2016 CLINICAL DATA:  Initial evaluation for acute dyspnea. EXAM: CT ANGIOGRAPHY CHEST WITH CONTRAST TECHNIQUE: Multidetector CT imaging of the chest was performed using the standard protocol during bolus administration of intravenous contrast. Multiplanar CT image reconstructions and MIPs  were obtained to evaluate the vascular anatomy. CONTRAST:  75 cc of Isovue 370. COMPARISON:  Prior radiograph from earlier same day. FINDINGS: Cardiovascular: Intrathoracic aorta of normal caliber and appearance without acute abnormality. Visualized great vessels within normal limits. Heart size normal. [name] pericardial effusion. Pulmonary arteries adequately opacified for evaluation. Main pulmonary artery measures within normal limits for size at 2.2 cm in  diameter. [name] filling defect to suggest acute pulmonary embolism. Re-formatted imaging confirms these findings. Mediastinum/Nodes: The visualized thyroid is normal. [name] pathologically enlarged mediastinal or hilar lymph nodes identified. [name] axillary adenopathy. Esophagus within normal limits. Lungs/Pleura: Patchy multi focal consolidation present within the posterior aspect of both upper and lower lobes, concerning for multifocal pneumonia. Changes most severe on the right. Findings may be infectious nature, although possible aspiration could be considered. Associated small right pleural effusion. [name] pulmonary edema. [name] pneumothorax. [name] worrisome pulmonary nodule or mass. Tracheobronchial tree remains patent. Upper Abdomen: Visualized portions of the upper abdomen are unremarkable. Musculoskeletal: [name] acute osseous abnormality. [name] worrisome lytic or blastic osseous lesions. Review of the MIP images confirms the above findings. IMPRESSION: 1. [name] CT evidence for acute pulmonary embolism. 2. Multi focal areas of consolidation involving both the right upper and lower lobes, concerning for multifocal pneumonia. Changes are slightly worse on the right. Findings may be infectious in nature. Possible aspiration pneumonitis could also be considered. 3. Small right pleural effusion. Electronically Signed   By: Jeannine Boga M.D.   On: 09/18/2016 14:27   Nm Hepatobiliary Liver Func  Result Date: 09/19/2016 CLINICAL DATA:  Acute cholecystitis. Right upper quadrant pain for 3 days. EXAM: NUCLEAR MEDICINE HEPATOBILIARY IMAGING TECHNIQUE: Sequential images of the abdomen were obtained out to 60 minutes following intravenous administration of radiopharmaceutical. RADIOPHARMACEUTICALS:  4.938 mCi Tc-71mCholetec IV. After 2 hours, 3 mg of morphine was injected and an additional 2.152 mCi Tc-949mholetec injected IV. COMPARISON:  Abdominal CT 07/20/2016 FINDINGS: Prompt uptake and biliary excretion of activity by the liver  is seen. Gallbladder activity is not visualized over the course of 2 hours. There is biliary activity and activity passes into the small bowel. 3 mg morphine administered IV followed by additional Choletec injection. [name] gallbladder was visualized after an additional 30 minutes. IMPRESSION: [name] gallbladder visualization consistent with acute cholecystitis, even after the administration of IV morphine. Electronically Signed   By: MeJeb Levering.D.   On: 09/19/2016 21:49   UsKoreantraoperative  Result Date: 09/20/2016 CLINICAL DATA:  Ultrasound was provided for use by the ordering physician, and a technical charge was applied by the performing facility.  [name] radiologist interpretation/professional services rendered.   Ct Abdomen Pelvis W Contrast  Result Date: 09/17/2016 CLINICAL DATA:  Pt presents to ED with reports of RUQ abdominal pain. Pt denies NVD. Pt reports he had a small bowel movement yesterday but thinks he might be constipated. Pt reports decreased appetite EXAM: CT ABDOMEN AND PELVIS WITH CONTRAST TECHNIQUE: Multidetector CT imaging of the abdomen and pelvis was performed using the standard protocol following bolus administration of intravenous contrast. CONTRAST:  10029mSOVUE-300 IOPAMIDOL (ISOVUE-300) INJECTION 61% COMPARISON:  Right upper quadrant ultrasound, 09/17/2016 FINDINGS: Lower chest: [name] acute abnormality. Hepatobiliary: Despite the unremarkable appearance of the gallbladder on the current ultrasound, on CT, the gallbladder is distended. Wall is prominent but not overtly thickened. There is pericholecystic inflammation. A stone projects in the gallbladder neck. The liver is unremarkable. There is [name] bile duct dilation. Pancreas: Unremarkable. [name] pancreatic ductal dilatation or surrounding inflammatory  changes. Spleen: Normal in size without focal abnormality. Adrenals/Urinary Tract: Adrenal glands are unremarkable. Kidneys are normal, without renal calculi, focal lesion, or  hydronephrosis. Bladder is unremarkable. Stomach/Bowel: Stomach is within normal limits. Appendix appears normal. [name] evidence of bowel wall thickening, distention, or inflammatory changes. Vascular/Lymphatic: [name] significant vascular findings are present. [name] enlarged abdominal or pelvic lymph nodes. Reproductive: Prostate is unremarkable. Other: [name] abdominal wall hernia or abnormality. [name] abdominopelvic ascites. Musculoskeletal: Unremarkable. IMPRESSION: 1. Gallbladder is distended with pericholecystic inflammation as well as a gallstone that projects in the gallbladder neck. Despite the normal appearance of the gallbladder on the current ultrasound, the findings strongly suggest acute cholecystitis. [name] bile duct stone or biliary dilation. 2. [name] other acute abnormality within the abdomen pelvis. Exam is otherwise unremarkable. Electronically Signed   By: Lajean Manes M.D.   On: 09/17/2016 11:59   Ir Perc Cholecystostomy  Result Date: 09/20/2016 INDICATION: Patient admitted with acute cholecystitis complicated by development of multi lobar pneumonia. As such, patient deemed a poor operative candidate and request made for placement of a percutaneous cholecystostomy tube for infection source control. EXAM: ULTRASOUND AND FLUOROSCOPIC-GUIDED CHOLECYSTOSTOMY TUBE PLACEMENT COMPARISON:  CT scan of the abdomen and pelvis - 09/17/2016; nuclear medicine HIDA scan - 09/19/2016; chest CT - 09/18/2026 MEDICATIONS: The patient is currently admitted to the hospital and on intravenous antibiotics. Antibiotics were administered within an appropriate time frame prior to skin puncture. ANESTHESIA/SEDATION: Moderate (conscious) sedation was employed during this procedure. A total of Versed 2.5 mg and Fentanyl 75 mcg was administered intravenously. Moderate Sedation Time: 20 minutes. The patient's level of consciousness and vital signs were monitored continuously by radiology nursing throughout the procedure under my direct  supervision. CONTRAST:  56m ISOVUE-300 IOPAMIDOL (ISOVUE-300) INJECTION 61% - administered into the gallbladder fossa. FLUOROSCOPY TIME:  36 seconds (10 mGy) COMPLICATIONS: None immediate. PROCEDURE: Informed written consent was obtained from the patient's mother after a discussion of the risks, benefits and alternatives to treatment. Questions regarding the procedure were encouraged and answered. A timeout was performed prior to the initiation of the procedure. The right upper abdominal quadrant was prepped and draped in the usual sterile fashion, and a sterile drape was applied covering the operative field. Maximum barrier sterile technique with sterile gowns and gloves were used for the procedure. A timeout was performed prior to the initiation of the procedure. Local anesthesia was provided with 1% lidocaine with epinephrine. Ultrasound scanning of the right upper quadrant demonstrates a mildly distended gallbladder with minimal amount of gallbladder wall thickening. Utilizing a transhepatic approach, a 22 gauge needle was advanced into the gallbladder under direct ultrasound guidance. An ultrasound image was saved for documentation purposes. Appropriate intraluminal puncture was confirmed with the efflux of bile and advancement of an 0.018 wire into the gallbladder lumen. The needle was exchanged for an [place]. A small amount of contrast was injected to confirm appropriate intraluminal positioning. Over a Benson wire, a 1952-French Cook cholecystomy tube was advanced into the gallbladder fossa, coiled and locked. Bile was aspirated and a small amount of contrast was injected as several post procedural spot fluoroscopic images were obtained in various obliquities. A small amount of bile was aspirated, capped and sent to the laboratory for analysis. The catheter was secured to the skin with suture, connected to a drainage bag and a dressing was placed. The patient tolerated the procedure well without  immediate post procedural complication. IMPRESSION: Successful ultrasound and fluoroscopic guided placement of a 10.2 French cholecystostomy tube. A  small sample of aspirated bile was sent to the laboratory for analysis. Electronically Signed   By: Sandi Mariscal M.D.   On: 09/20/2016 11:06   Dg Chest Port 1 View  Result Date: 09/20/2016 CLINICAL DATA:  Acute respiratory failure. EXAM: PORTABLE CHEST 1 VIEW COMPARISON:  Radiograph of September 19, 2016. FINDINGS: Stable cardiomediastinal silhouette. Hypoinflation of the lungs is noted. Increased bilateral perihilar and basilar interstitial densities are noted concerning for pulmonary edema. Minimal right pleural effusion is noted. Bony thorax is unremarkable. IMPRESSION: Increased bilateral perihilar and basilar interstitial densities are noted concerning for pulmonary edema. Hypoinflation of the lungs is noted. Electronically Signed   By: Marijo Conception, M.D.   On: 09/20/2016 14:21   Dg Chest Port 1 View  Result Date: 09/19/2016 CLINICAL DATA:  Pneumothorax EXAM: PORTABLE CHEST 1 VIEW COMPARISON:  Yesterday FINDINGS: Lungs remain under aerated. Bilateral airspace disease is worse. Normal heart size. Upper mediastinum is prominent likely due to low volumes and vascular crowding. There is [name] pneumothorax. IMPRESSION: Worsening bilateral airspace disease. Electronically Signed   By: Marybelle Killings M.D.   On: 09/19/2016 07:50   US Abdomen Limited Ruq  Result Date: 09/17/2016 CLINICAL DATA:  One day history of right upper quadrant pain EXAM: US ABDOMEN LIMITED - RIGHT UPPER QUADRANT COMPARISON:  None. FINDINGS: Gallbladder: [name] gallstones or wall thickening visualized. There is [name] pericholecystic fluid. [name] sonographic Murphy sign noted by sonographer. Common bile duct: Diameter: 4 mm. [name] intrahepatic or extrahepatic biliary duct dilatation. Liver: [name] focal lesion identified. Within normal limits in parenchymal echogenicity. IMPRESSION: Study within normal limits.  Electronically Signed   By: Lowella Grip III M.D.   On: 09/17/2016 10:36    Assessment:   Joshua [name] is a 20 y.o. male with acute cholecystitis, Klebsiella bacteremia, moderate pneumonia with possible aspiration. He is s/p Perc chole done 10/11 and has been on broad spectrum abx but has had persistent fevers. WBC has decreased and he is clinically improving.  He was having continual fevers despite being on Meropenem and azithromycin. MRSA nasal PCR is negative and has not received any MRSA coverage.  The Klebsiella is quite a sensitive organism however the infections are likely polymicrobial including anaerobes.  I see [name] reason for using a carbapenem at this point.  However may be having a beta lactam induced fever so if fever worsens change abx and monitor.   Recommendations Change to IV cipro and oral flagyl in case of beta lactam induced fever Consider repeat CT if fevers persist to eval for abscess   Thank you very much for allowing me to participate in the care of this patient. Please call with questions.   Cheral Marker. Ola Spurr, MD

## 2016-09-22 NOTE — Progress Notes (Signed)
Pharmacy Antibiotic Note  Joshua [name] is a 20 y.o. male admitted on 09/17/2016 with cholecystitis and Klebsiella bacteremia. Patient admitted to ICU for ARDS. Previously on Zosyn. Currently on Unasyn which klebsiella is sensitive to. Patient is also currently on Azithromycin for Atypical pathogens.    ADD: MD Mungal would like to restart Merrem due to patient still spiking fevers.   Plan: Will discontinue Unasyn and start Merrem 1 g IV q8 hours.     Height: 5' (152.4 cm) Weight: 212 lb 11.9 oz (96.5 kg) IBW/kg (Calculated) : 50  Temp (24hrs), Avg:100.7 F (38.2 C), Min:98.5 F (36.9 C), Max:103.4 F (39.7 C)   Recent Labs Lab 09/17/16 0943 09/18/16 0601 09/19/16 0433 09/20/16 1115 09/20/16 1421 09/21/16 0545 09/22/16 0522  WBC  --  21.5* 15.2*  --  7.9 5.9 6.3  CREATININE  --  1.10 1.00  --  0.97 0.89 1.16  LATICACIDVEN 1.4  --   --  1.0 0.8  --   --     Estimated Creatinine Clearance: 97.7 mL/min (by C-G formula based on SCr of 1.16 mg/dL).    No Known Allergies  Antimicrobials this admission: Zosyn 10/8 >> 10/9 Meropenem 10/9 >> 10/12 Azithromycin 10/9 >> Ampicillin/Sulbactam 10/12 >>   Dose adjustments this admission:   Microbiology results: 10/8 BCx: K pneumoniae pan-sensitive except ampicillin 10/8 MRSA PCR: negative  Pharmacy will continue to monitor and adjust per consult.   Joshua [name] 09/22/2016 11:50 AM

## 2016-09-22 NOTE — Progress Notes (Signed)
Pt currently in stable condition at this time. Pt resting comfortably on 0.234mcg/kg/min of Precedex. Pt had one episode of severe agitation on my shift. MD aware. Pt had one temperature of 102.8 on my shift treated with Tylenol. No complaints of pain. Pt remains A&O. Mother at bedside. Full assessment in Epic.

## 2016-09-22 NOTE — Progress Notes (Signed)
Pharmacy Antibiotic Note  Joshua [name] is a 20 y.o. male admitted on 09/17/2016 with cholecystitis and Klebsiella bacteremia. Patient admitted to ICU for ARDS. Previously on Zosyn. Currently on Unasyn which klebsiella is sensitive to. Patient is also currently on Azithromycin for Atypical pathogens.   Plan: Ampicillin/Sulbactam 3g IV Q6hr.   Height: 5' (152.4 cm) Weight: 212 lb 11.9 oz (96.5 kg) IBW/kg (Calculated) : 50  Temp (24hrs), Avg:101 F (38.3 C), Min:98.5 F (36.9 C), Max:103.4 F (39.7 C)   Recent Labs Lab 09/17/16 0943 09/18/16 0601 09/19/16 0433 09/20/16 1115 09/20/16 1421 09/21/16 0545 09/22/16 0522  WBC  --  21.5* 15.2*  --  7.9 5.9 6.3  CREATININE  --  1.10 1.00  --  0.97 0.89 1.16  LATICACIDVEN 1.4  --   --  1.0 0.8  --   --     Estimated Creatinine Clearance: 97.7 mL/min (by C-G formula based on SCr of 1.16 mg/dL).    No Known Allergies  Antimicrobials this admission: Zosyn 10/8 >> 10/9 Meropenem 10/9 >> 10/12 Azithromycin 10/9 >> Ampicillin/Sulbactam 10/12 >>   Dose adjustments this admission:   Microbiology results: 10/8 BCx: K pneumoniae pan-sensitive except ampicillin 10/8 MRSA PCR: negative  Pharmacy will continue to monitor and adjust per consult.   Pascuala Klutts D 09/22/2016 8:22 AM

## 2016-09-22 NOTE — Progress Notes (Signed)
CC: Fevers Subjective: Patient has continued to be intermittently febrile and remains in the ICU. His abdominal discomfort appears to be markedly improved. Has been tolerating a diet.  Objective: Vital signs in last 24 hours: Temp:  [98.5 F (36.9 C)-103.4 F (39.7 C)] 98.9 F (37.2 C) (10/13 1100) Pulse Rate:  [96-141] 108 (10/13 1000) Resp:  [16-41] 31 (10/13 1000) BP: (96-180)/(57-126) 108/57 (10/13 1000) SpO2:  [88 %-100 %] 90 % (10/13 1000) FiO2 (%):  [35 %-55 %] 45 % (10/13 1100) Weight:  [96.5 kg (212 lb 11.9 oz)] 96.5 kg (212 lb 11.9 oz) (10/13 0500) Last BM Date: 09/19/16  Intake/Output from previous day: 10/12 0701 - 10/13 0700 In: 617.8 [P.O.:180; I.V.:37.8; IV Piggyback:400] Out: 2250 [Urine:1225; Drains:1025] Intake/Output this shift: Total I/O In: 413.3 [P.O.:400; I.V.:13.3] Out: -   Physical exam:  Gen.: No acute distress Chest: Coarse breath sounds throughout Heart: Tachycardic Abdomen: Soft, purple tender to palpation at the drain site, nondistended. Cholecystostomy tube in place draining a bilious fluid.  Lab Results: CBC   Recent Labs  09/21/16 0545 09/22/16 0522  WBC 5.9 6.3  HGB 14.6 14.8  HCT 41.2 42.3  PLT 185 176   BMET  Recent Labs  09/21/16 0545 09/22/16 0522  NA 133* 134*  K 3.5 3.7  CL 96* 93*  CO2 26 32  GLUCOSE 97 103*  BUN 11 11  CREATININE 0.89 1.16  CALCIUM 8.2* 8.2*   PT/INR No results for input(s): LABPROT, INR in the last 72 hours. ABG No results for input(s): PHART, HCO3 in the last 72 hours.  Invalid input(s): PCO2, PO2  Studies/Results: No results found.  Anti-infectives: Anti-infectives    Start     Dose/Rate Route Frequency Ordered Stop   09/22/16 1200  meropenem (MERREM) 1 g in sodium chloride 0.9 % 100 mL IVPB  Status:  Discontinued     1 g 200 mL/hr over 30 Minutes Intravenous Every 8 hours 09/22/16 1150 09/22/16 1420   09/21/16 1800  Ampicillin-Sulbactam (UNASYN) 3 g in sodium chloride 0.9 % 100  mL IVPB  Status:  Discontinued     3 g 100 mL/hr over 60 Minutes Intravenous Every 6 hours 09/21/16 1456 09/22/16 1150   09/19/16 1000  azithromycin (ZITHROMAX) tablet 250 mg     250 mg Oral Daily 09/18/16 1424 09/22/16 0920   09/18/16 2200  piperacillin-tazobactam (ZOSYN) IVPB 3.375 g  Status:  Discontinued     3.375 g 12.5 mL/hr over 240 Minutes Intravenous Every 8 hours 09/18/16 1303 09/18/16 1317   09/18/16 1430  azithromycin (ZITHROMAX) tablet 500 mg     500 mg Oral Daily 09/18/16 1424 09/18/16 1537   09/18/16 1400  meropenem (MERREM) 1 g in sodium chloride 0.9 % 100 mL IVPB  Status:  Discontinued     1 g 200 mL/hr over 30 Minutes Intravenous Every 8 hours 09/18/16 1319 09/21/16 1456   09/17/16 1800  piperacillin-tazobactam (ZOSYN) IVPB 3.375 g  Status:  Discontinued     3.375 g 12.5 mL/hr over 240 Minutes Intravenous Every 6 hours 09/17/16 1457 09/18/16 1303   09/17/16 0945  piperacillin-tazobactam (ZOSYN) IVPB 3.375 g     3.375 g 12.5 mL/hr over 240 Minutes Intravenous  Once 09/17/16 0939 09/17/16 1043      Assessment/Plan:  20 year old male status post percutaneous cholecystostomy for acute cholecystitis in the setting of bacteremia and pneumonia. Appreciate critical care, infectious disease, pharmacy, pulmonary assistance. At this point there is no indication for any urgent operative intervention  as his cholecystitis has been treated with a drain. Primary treatment and goals resolve around pneumonia, bacteremia, fevers. We'll defer treatment plan to our medicine colleagues at this point.  Ely Spragg T. Tonita Cong, MD, FACS  09/22/2016

## 2016-09-22 NOTE — Progress Notes (Signed)
Update: Patient doing well this afternoon, awake and interactive.  Mother at bedside and updated on current clinical status.  Case discussed with Dr. Sampson GoonFitzgerald, ID.  At this time for better tissue penetration, he is going to switch the antibiotics to IV cipro and IV flagyl, for his acute cholecystitis, bacteremia, and RLL PNA due to Klebsiella pneumonaie from his cholecystitis.  Stephanie AcreVishal Liliani Bobo, MD [facility] Pager [phone]- [phone] (please enter 7-digits) On Call Pager - [phone] (please enter 7-digits)

## 2016-09-22 NOTE — Plan of Care (Signed)
Problem: Safety: Goal: Ability to remain free from injury will improve Outcome: Progressing Pt remained free from falls on my shift.   Problem: Pain Managment: Goal: General experience of comfort will improve Outcome: Progressing Pt had no complaints of pain on my shift.

## 2016-09-22 NOTE — Consult Note (Addendum)
[facility] Consultation      Assessment and Plan:  A: -Acute hypoxemic respiratory failure. RLL pneumonia, likely related to underlying acute cholecystitis. -Sepsis, secondary to above. -Right lower lobe atelectasis with elevated right sided diaphragm and RLL PNA - secondary to acute cholecystitis -History of autism. - Fevers - secondary to sepsis - Bacteremia - Klebsiella pneumonia  P: s/p ultrasound guided cholecystostomy tube placement by IR on 10/11 Broad-spectrum antibiotics (Meropenem and Azithro)  Azithromycin  Meropenem 10/10>10/12, restarted 10/13  Unasyn 10/12>10/13  Bld Cx 10/8> Klebsiella pneumonia  Chole Tube Cx 10/11> Recheck blood cultures ID consult CTA chest with no PE on 10/9 Wean HFNC as tolerated Maintain O2 sats > 88% Agitation - on low dose precedex gtt  Date: 09/22/2016  MRN# [id number] Joshua [name] 25-Mar-1996  Referring Physician: Dr. Elpidio Anis.   Joshua [name] is a 20 y.o. old male seen in consultation for chief complaint of:    Chief Complaint  Patient presents with  . Abdominal Pain    HPI:  Patient is a 20 year old male, he is accompanied by his mother. Patient presented to the hospital with increasing right upper quadrant abdominal pain. CT abdomen was performed on 09/17/16, suggestive of acute cholecystitis. Over the last 24 hours. Patient's oxygen saturations have dropped initially was on room air. Subsequently, this has been increased to 6 L via nasal cannula. The patient complains of mild dyspnea, however, he does not look to be in any respiratory distress.  On review of the patient's imaging. There does appear to be an elevated right-sided diaphragm, possible new right sided infiltrates, when combined with the presence of fever. This may be suggestive of pneumonia.  SUBJ: Still with temp elevations over night,controlling with tylenol and ibuprofen   PMHX:   Past Medical History:  Diagnosis Date  . Autism   .  Irritable bowel syndrome    Surgical Hx:  Past Surgical History:  Procedure Laterality Date  . EYE SURGERY    . IR GENERIC HISTORICAL  09/20/2016   IR PERC CHOLECYSTOSTOMY 09/20/2016 [name], MD [facility]-INTERV RAD   Family Hx:  History reviewed. No pertinent family history. Social Hx:   Social History  Substance Use Topics  . Smoking status: Never Smoker  . Smokeless tobacco: Never Used  . Alcohol use Not on file   Medication:       Allergies:  Review of patient's allergies indicates no known allergies.  Review of Systems: Gen: admits  fever, sweats, chills Cvc:  No dizziness, chest pain. Resp:   Denies cough or sputum production Gi: Denies swallowing difficulty,  Admits nausea, admits abdominal pain Gu:  Denies bladder incontinence, burning urine Ext:   No Joint pain, stiffness. Skin: No skin rash,  hives  Endoc:  No polyuria, polydipsia. Psych: No depression, insomnia. Other:  All other systems were reviewed with the patient and were negative other that what is mentioned in the HPI.   Physical Examination:   VS: BP (!) 108/57   Pulse (!) 108   Temp 98.9 F (37.2 C) (Oral)   Resp (!) 31   Ht 5' (1.524 m)   Wt 212 lb 11.9 oz (96.5 kg)   SpO2 90%   BMI 41.55 kg/m   General Appearance: No distress  Neuro:without focal findings,  speech normal,  HEENT: PERRLA, EOM intact.   Pulmonary: normal breath sounds, No wheezing. Decreased air entry in both lung bases. tachypnea CardiovascularNormal S1,S2.  No m/r/g.   Abdomen: Benign, Soft, moderate  RUQ tenderness Renal:  No costovertebral tenderness  GU:  No performed at this time. Endoc: No evident thyromegaly, no signs of acromegaly. Skin:   warm, no rashes, no ecchymosis  Extremities: normal, no cyanosis, clubbing.  Other findings:    LABORATORY PANEL:   CBC  Recent Labs Lab 09/22/16 0522  WBC 6.3  HGB 14.8  HCT 42.3  PLT 176    ------------------------------------------------------------------------------------------------------------------  Chemistries   Recent Labs Lab 09/20/16 1421  09/22/16 0522  NA 130*  < > 134*  K 3.8  < > 3.7  CL 98*  < > 93*  CO2 23  < > 32  GLUCOSE 113*  < > 103*  BUN 11  < > 11  CREATININE 0.97  < > 1.16  CALCIUM 8.1*  < > 8.2*  AST 35  --   --   ALT 31  --   --   ALKPHOS 62  --   --   BILITOT 1.3*  --   --   < > = values in this interval not displayed. ------------------------------------------------------------------------------------------------------------------  Cardiac Enzymes  Recent Labs Lab 09/17/16 0918  TROPONINI <0.03   ------------------------------------------------------------  RADIOLOGY:  Dg Chest Port 1 View  Result Date: 09/20/2016 CLINICAL DATA:  Acute respiratory failure. EXAM: PORTABLE CHEST 1 VIEW COMPARISON:  Radiograph of September 19, 2016. FINDINGS: Stable cardiomediastinal silhouette. Hypoinflation of the lungs is noted. Increased bilateral perihilar and basilar interstitial densities are noted concerning for pulmonary edema. Minimal right pleural effusion is noted. Bony thorax is unremarkable. IMPRESSION: Increased bilateral perihilar and basilar interstitial densities are noted concerning for pulmonary edema. Hypoinflation of the lungs is noted. Electronically Signed   By: Lupita RaiderJames  Green Jr, M.D.   On: 09/20/2016 14:21     Thank  you for the consultation and for allowing [facility] to assist in the care of your patient. Our recommendations are noted above.  Please contact us if we can be of further service.  CCM Time - 35 mins  Stephanie AcreVishal Timberlyn Pickford, MD [place] Pager [phone]- [phone] (please enter 7-digits) On Call Pager - [phone] (please enter 7-digits)   09/22/2016

## 2016-09-23 ENCOUNTER — Inpatient Hospital Stay: Payer: Medicaid Other

## 2016-09-23 ENCOUNTER — Encounter: Payer: Self-pay | Admitting: Radiology

## 2016-09-23 LAB — BASIC METABOLIC PANEL
ANION GAP: 9 (ref 5–15)
BUN: 12 mg/dL (ref 6–20)
CALCIUM: 8.3 mg/dL — AB (ref 8.9–10.3)
CO2: 32 mmol/L (ref 22–32)
Chloride: 94 mmol/L — ABNORMAL LOW (ref 101–111)
Creatinine, Ser: 0.89 mg/dL (ref 0.61–1.24)
GFR calc Af Amer: >60 mL/min (ref >60)
GFR calc non Af Amer: >60 mL/min (ref >60)
GLUCOSE: 122 mg/dL — AB (ref 65–99)
Potassium: 3.3 mmol/L — ABNORMAL LOW (ref 3.5–5.1)
Sodium: 135 mmol/L (ref 135–145)

## 2016-09-23 LAB — PHOSPHORUS: PHOSPHORUS: 1.5 mg/dL — AB (ref 2.5–4.6)

## 2016-09-23 LAB — CBC
HEMATOCRIT: 41.8 % (ref 40.0–52.0)
HEMOGLOBIN: 14.3 g/dL (ref 13.0–18.0)
MCH: 29.7 pg (ref 26.0–34.0)
MCHC: 34.3 g/dL (ref 32.0–36.0)
MCV: 86.6 fL (ref 80.0–100.0)
Platelets: 195 10*3/uL (ref 150–440)
RBC: 4.82 MIL/uL (ref 4.40–5.90)
RDW: 12.7 % (ref 11.5–14.5)
WBC: 6.7 10*3/uL (ref 3.8–10.6)

## 2016-09-23 LAB — MAGNESIUM: MAGNESIUM: 2 mg/dL (ref 1.7–2.4)

## 2016-09-23 MED ORDER — POTASSIUM CHLORIDE 20 MEQ PO PACK: 40.0000 meq | PACK | Freq: Once | ORAL | Status: DC

## 2016-09-23 MED ORDER — K PHOS MONO-SOD PHOS DI & MONO 155-852-130 MG PO TABS: 1000.0000 mg | ORAL_TABLET | Freq: Once | ORAL | Status: DC

## 2016-09-23 MED ORDER — IOPAMIDOL (ISOVUE-300) INJECTION 61%
15.0000 mL | INTRAVENOUS | Status: AC
  Administered 2016-09-23 (×2): 15 mL via ORAL

## 2016-09-23 MED ORDER — IOPAMIDOL (ISOVUE-300) INJECTION 61%
100.0000 mL | Freq: Once | INTRAVENOUS | Status: AC | PRN
  Administered 2016-09-23: 100 mL via INTRAVENOUS

## 2016-09-23 MED ORDER — POTASSIUM PHOSPHATE MONOBASIC 500 MG PO TABS
1000.0000 mg | ORAL_TABLET | Freq: Once | ORAL | Status: DC
  Filled 2016-09-23: qty 2

## 2016-09-23 NOTE — Progress Notes (Signed)
Second bottle of PO contrast given to patient.

## 2016-09-23 NOTE — Progress Notes (Signed)
Pt has finished drinking contrast.

## 2016-09-23 NOTE — Consult Note (Signed)
[facility] Medicine Consultation      Assessment and Plan:  A: -Acute hypoxemic respiratory failure. RLL pneumonia, likely related to underlying acute cholecystitis. -Sepsis, secondary to above. -Right lower lobe atelectasis with elevated right sided diaphragm and RLL PNA - secondary to acute cholecystitis -History of autism. - Fevers - secondary to sepsis - Bacteremia - Klebsiella pneumonia  P: s/p ultrasound guided cholecystostomy tube placement by IR on 10/11 Broad-spectrum antibiotics (Meropenem and Azithro)  IV Cipro 10/13  IV Flagyl 10/13>  Azithromycin stopped  Meropenem 10/10>10/12, restarted 10/13 then stopped 10/13 after speaking with ID  Unasyn 10/12>10/13  Bld Cx 10/8> Klebsiella pneumonia  Chole Tube Cx 10/11> Recheck blood cultures ID recs appreciated - start IV cipro/flagyl, for better tissue penetration given continued febrile episodes.  If fevers continue then re CT looking for fluid collection/abscess/empyema CTA chest with no PE on 10/9 Maintain O2 sats > 88% Agitation - on low dose precedex gtt, now off 10/14 CT A/P/Chest today given continued fevers.   Date: 09/23/2016  MRN# [id number] Joshua [name] [birth year]  Referring Physician: Dr. Elpidio Anis.   Joshua [name] is a 20 y.o. old male seen in consultation for chief complaint of:    Chief Complaint  Patient presents with  . Abdominal Pain    HPI:  Patient is a 20 year old male, he is accompanied by his mother. Patient presented to the hospital with increasing right upper quadrant abdominal pain. CT abdomen was performed on 09/17/16, suggestive of acute cholecystitis. Over the last 24 hours. Patient's oxygen saturations have dropped initially was on room air. Subsequently, this has been increased to 6 L via nasal cannula. The patient complains of mild dyspnea, however, he does not look to be in any respiratory distress.  On review of the patient's imaging. There does appear to be an elevated  right-sided diaphragm, possible new right sided infiltrates, when combined with the presence of fever. This may be suggestive of pneumonia.  SUBJ: Still with temp elevations over night,controlling with tylenol and ibuprofen, doing much better today, sitting in up in bed, on [place], off HFNC   PMHX:   Past Medical History:  Diagnosis Date  . Autism   . Irritable bowel syndrome    Surgical Hx:  Past Surgical History:  Procedure Laterality Date  . EYE SURGERY    . IR GENERIC HISTORICAL  09/20/2016   IR PERC CHOLECYSTOSTOMY 09/20/2016 [name], MD [facility]-INTERV RAD   Family Hx:  History reviewed. No pertinent family history. Social Hx:   Social History  Substance Use Topics  . Smoking status: Never Smoker  . Smokeless tobacco: Never Used  . Alcohol use Not on file   Medication:       Allergies:  Review of patient's allergies indicates no known allergies.  Review of Systems: Gen: admits  fever, sweats, chills Cvc:  No dizziness, chest pain. Resp:   Denies cough or sputum production Gi: Denies swallowing difficulty,  Admits nausea, admits abdominal pain Gu:  Denies bladder incontinence, burning urine Ext:   No Joint pain, stiffness. Skin: No skin rash,  hives  Endoc:  No polyuria, polydipsia. Psych: No depression, insomnia. Other:  All other systems were reviewed with the patient and were negative other that what is mentioned in the HPI.   Physical Examination:   VS: BP 107/66   Pulse 99   Temp 99.4 F (37.4 C) (Oral)   Resp (!) 24   Ht 5' (1.524 m)   Wt 212 lb 4.9  oz (96.3 kg)   SpO2 96%   BMI 41.46 kg/m   General Appearance: No distress  Neuro:without focal findings,  speech normal,  HEENT: PERRLA, EOM intact.   Pulmonary: No wheezing. Decreased air entry in both lung bases. tachypnea CardiovascularNormal S1,S2.  No m/r/g.   Abdomen: Benign, Soft, moderate RUQ tenderness Renal:  No costovertebral tenderness  GU:  No performed at this time. Endoc: No evident  thyromegaly, no signs of acromegaly. Skin:   warm, no rashes, no ecchymosis  Extremities: normal, no cyanosis, clubbing.  Other findings:    LABORATORY PANEL:   CBC  Recent Labs Lab 09/23/16 0354  WBC 6.7  HGB 14.3  HCT 41.8  PLT 195   ------------------------------------------------------------------------------------------------------------------  Chemistries   Recent Labs Lab 09/20/16 1421  09/23/16 0354  NA 130*  < > 135  K 3.8  < > 3.3*  CL 98*  < > 94*  CO2 23  < > 32  GLUCOSE 113*  < > 122*  BUN 11  < > 12  CREATININE 0.97  < > 0.89  CALCIUM 8.1*  < > 8.3*  MG  --   --  2.0  AST 35  --   --   ALT 31  --   --   ALKPHOS 62  --   --   BILITOT 1.3*  --   --   < > = values in this interval not displayed. ------------------------------------------------------------------------------------------------------------------  Cardiac Enzymes  Recent Labs Lab 09/17/16 0918  TROPONINI <0.03   ------------------------------------------------------------  RADIOLOGY:  Dg Chest 1 View  Result Date: 09/23/2016 CLINICAL DATA:  Pneumonia EXAM: CHEST 1 VIEW COMPARISON:  Multiple recent previous exams. FINDINGS: 0524 hours. Low lung volumes with stable asymmetric elevation right hemidiaphragm. The diffuse interstitial and airspace disease seen previously has improved in the interval with some patchy bilateral airspace disease persisting with slight central predominance. Cardiopericardial silhouette is at upper limits of normal for size. The visualized bony structures of the thorax are intact. Telemetry leads overlie the chest. IMPRESSION: Interval improvement in lung aeration, suggesting resolving edema. Electronically Signed   By: Kennith CenterEric  Mansell M.D.   On: 09/23/2016 08:35   Dg Abd 1 View  Result Date: 09/23/2016 CLINICAL DATA:  Acute cholecystitis EXAM: ABDOMEN - 1 VIEW COMPARISON:  None. FINDINGS: Percutaneous drainage catheter overlies the right abdomen. Prominent  gastric bubble. Air is stool noted in the colon. IMPRESSION: No evidence of bowel obstruction. Electronically Signed   By: Kennith CenterEric  Mansell M.D.   On: 09/23/2016 08:37     Thank  you for the consultation and for allowing [facility] to assist in the care of your patient. Our recommendations are noted above.  Please contact us if we can be of further service.  CCM Time - 35 mins  Stephanie AcreVishal Arrington Bencomo, MD  Pulmonary and Critical Care Pager [phone]- [phone] (please enter 7-digits) On Call Pager - [phone] (please enter 7-digits)   09/23/2016

## 2016-09-23 NOTE — Progress Notes (Signed)
Pt care assumed. Pt resting in bed with eyes closed.  

## 2016-09-23 NOTE — Progress Notes (Signed)
Pt given first bottle of PO Contrast.

## 2016-09-23 NOTE — Progress Notes (Signed)
CC: Fevers Subjective: Patient remains in the ICU. He had another fever overnight. Otherwise does not have any real complaints. He has some tenderness of the drain site but states that it is manageable. He is hungry and tolerating a diet.  Objective: Vital signs in last 24 hours: Temp:  [98 F (36.7 C)-102.7 F (39.3 C)] 99.4 F (37.4 C) (10/14 0500) Pulse Rate:  [78-114] 99 (10/14 0700) Resp:  [16-33] 24 (10/14 0700) BP: (107-127)/(47-84) 107/66 (10/14 0700) SpO2:  [94 %-99 %] 96 % (10/14 0700) FiO2 (%):  [35 %-45 %] 35 % (10/13 1500) Weight:  [96.3 kg (212 lb 4.9 oz)] 96.3 kg (212 lb 4.9 oz) (10/14 0358) Last BM Date: 09/22/16  Intake/Output from previous day: 10/13 0701 - 10/14 0700 In: 1113.3 [P.O.:700; I.V.:13.3; IV Piggyback:400] Out: 1375 [Urine:1100; Drains:275] Intake/Output this shift: Total I/O In: -  Out: 150 [Drains:150]  Physical exam:  Gen.: No acute distress Chest: Coarse breath sounds throughout Heart: Regular rate and rhythm Abdomen: Soft, purple tender to palpation at the drain site, nondistended. Cholecystostomy tube draining a ileus fluid.  Lab Results: CBC   Recent Labs  09/22/16 0522 09/23/16 0354  WBC 6.3 6.7  HGB 14.8 14.3  HCT 42.3 41.8  PLT 176 195   BMET  Recent Labs  09/22/16 0522 09/23/16 0354  NA 134* 135  K 3.7 3.3*  CL 93* 94*  CO2 32 32  GLUCOSE 103* 122*  BUN 11 12  CREATININE 1.16 0.89  CALCIUM 8.2* 8.3*   PT/INR No results for input(s): LABPROT, INR in the last 72 hours. ABG No results for input(s): PHART, HCO3 in the last 72 hours.  Invalid input(s): PCO2, PO2  Studies/Results: Dg Chest 1 View  Result Date: 09/23/2016 CLINICAL DATA:  Pneumonia EXAM: CHEST 1 VIEW COMPARISON:  Multiple recent previous exams. FINDINGS: 0524 hours. Low lung volumes with stable asymmetric elevation right hemidiaphragm. The diffuse interstitial and airspace disease seen previously has improved in the interval with some patchy  bilateral airspace disease persisting with slight central predominance. Cardiopericardial silhouette is at upper limits of normal for size. The visualized bony structures of the thorax are intact. Telemetry leads overlie the chest. IMPRESSION: Interval improvement in lung aeration, suggesting resolving edema. Electronically Signed   By: Kennith CenterEric  Mansell M.D.   On: 09/23/2016 08:35   Dg Abd 1 View  Result Date: 09/23/2016 CLINICAL DATA:  Acute cholecystitis EXAM: ABDOMEN - 1 VIEW COMPARISON:  None. FINDINGS: Percutaneous drainage catheter overlies the right abdomen. Prominent gastric bubble. Air is stool noted in the colon. IMPRESSION: No evidence of bowel obstruction. Electronically Signed   By: Kennith CenterEric  Mansell M.D.   On: 09/23/2016 08:37   Ct Chest W Contrast  Result Date: 09/23/2016 CLINICAL DATA:  Follow-up pneumonia.  Follow-up colostomy 2. EXAM: CT CHEST, ABDOMEN, AND PELVIS WITH CONTRAST TECHNIQUE: Multidetector CT imaging of the chest, abdomen and pelvis was performed following the standard protocol during bolus administration of intravenous contrast. CONTRAST:  100mL ISOVUE-300 IOPAMIDOL (ISOVUE-300) INJECTION 61% COMPARISON:  CT of the chest September 18, 2016 and CT the abdomen/ pelvis September 17, 2016 FINDINGS: CT CHEST FINDINGS Cardiovascular: No significant vascular findings. Normal heart size. No pericardial effusion. Mediastinum/Nodes: No enlarged mediastinal, hilar, or axillary lymph nodes. Thyroid gland, trachea, and esophagus demonstrate no significant findings. Lungs/Pleura: The central airways are normal. No pneumothorax. Bilateral patchy infiltrates remain. Overall, there has been significant interval improvement. Opacity posteriorly in the left base on series 4, image 100 is more focal in  the interval. Musculoskeletal: No chest wall mass or suspicious bone lesions identified. CT ABDOMEN PELVIS FINDINGS Hepatobiliary: A cholecystostomy tube is seen in the gallbladder. Surrounding stranding  identified. There is a fluid collection adjacent to the inferior right hepatic lobe on axial image 54 measuring 4.6 by 4.2 by 3.0 cm. This extends towards the inferior aspect of the gallbladder. No liver masses. The portal vein is normal. Pancreas: Unremarkable. No pancreatic ductal dilatation or surrounding inflammatory changes. Spleen: Normal in size without focal abnormality. Adrenals/Urinary Tract: Adrenal glands are unremarkable. Kidneys are normal, without renal calculi, focal lesion, or hydronephrosis. Bladder is unremarkable. Stomach/Bowel: Stomach is within normal limits. Appendix appears normal. No evidence of bowel wall thickening, distention, or inflammatory changes. Vascular/Lymphatic: No significant vascular findings are present. No enlarged abdominal or pelvic lymph nodes. Reproductive: Prostate is unremarkable. Other: No free air.  No other significant abnormalities. Musculoskeletal: No acute or significant osseous findings. IMPRESSION: 1. Bilateral patchy pulmonary infiltrates have overall improved in the interval but significant infiltrate remains. Focal infiltrate in the posterior inferior left lung base has actually become more focal in the interval. Recommend follow-up to complete resolution. 2. A cholecystostomy tube is identified. 3. There is a fluid collection adjacent to the undersurface of the right hepatic lobe which extends towards the inferior gallbladder. The findings are nonspecific but an abscess is not excluded. Leakage from the gallbladder resulting in a biloma would demonstrate a similar appearance. These results will be called to the ordering clinician or representative by the Radiologist Assistant, and communication documented in the PACS or zVision Dashboard. Electronically Signed   By: Gerome Sam III M.D   On: 09/23/2016 11:58   Ct Abdomen Pelvis W Contrast  Result Date: 09/23/2016 CLINICAL DATA:  Follow-up pneumonia.  Follow-up colostomy 2. EXAM: CT CHEST, ABDOMEN,  AND PELVIS WITH CONTRAST TECHNIQUE: Multidetector CT imaging of the chest, abdomen and pelvis was performed following the standard protocol during bolus administration of intravenous contrast. CONTRAST:  ISOVUE-300 IOPAMIDOL (ISOVUE-300) INJECTION 61% COMPARISON:  CT of the chest September 18, 2016 and CT the abdomen/ pelvis September 17, 2016 FINDINGS: CT CHEST FINDINGS Cardiovascular: No significant vascular findings. Normal heart size. No pericardial effusion. Mediastinum/Nodes: No enlarged mediastinal, hilar, or axillary lymph nodes. Thyroid gland, trachea, and esophagus demonstrate no significant findings. Lungs/Pleura: The central airways are normal. No pneumothorax. Bilateral patchy infiltrates remain. Overall, there has been significant interval improvement. Opacity posteriorly in the left base on series 4, image 100 is more focal in the interval. Musculoskeletal: No chest wall mass or suspicious bone lesions identified. CT ABDOMEN PELVIS FINDINGS Hepatobiliary: A cholecystostomy tube is seen in the gallbladder. Surrounding stranding identified. There is a fluid collection adjacent to the inferior right hepatic lobe on axial image 54 measuring 4.6 by 4.2 by 3.0 cm. This extends towards the inferior aspect of the gallbladder. No liver masses. The portal vein is normal. Pancreas: Unremarkable. No pancreatic ductal dilatation or surrounding inflammatory changes. Spleen: Normal in size without focal abnormality. Adrenals/Urinary Tract: Adrenal glands are unremarkable. Kidneys are normal, without renal calculi, focal lesion, or hydronephrosis. Bladder is unremarkable. Stomach/Bowel: Stomach is within normal limits. Appendix appears normal. No evidence of bowel wall thickening, distention, or inflammatory changes. Vascular/Lymphatic: No significant vascular findings are present. No enlarged abdominal or pelvic lymph nodes. Reproductive: Prostate is unremarkable. Other: No free air.  No other significant  abnormalities. Musculoskeletal: No acute or significant osseous findings. IMPRESSION: 1. Bilateral patchy pulmonary infiltrates have overall improved in the interval but significant  infiltrate remains. Focal infiltrate in the posterior inferior left lung base has actually become more focal in the interval. Recommend follow-up to complete resolution. 2. A cholecystostomy tube is identified. 3. There is a fluid collection adjacent to the undersurface of the right hepatic lobe which extends towards the inferior gallbladder. The findings are nonspecific but an abscess is not excluded. Leakage from the gallbladder resulting in a biloma would demonstrate a similar appearance. These results will be called to the ordering clinician or representative by the Radiologist Assistant, and communication documented in the PACS or zVision Dashboard. Electronically Signed   By: Gerome Sam III M.D   On: 09/23/2016 11:58    Anti-infectives: Anti-infectives    Start     Dose/Rate Route Frequency Ordered Stop   09/22/16 1800  Ampicillin-Sulbactam (UNASYN) 3 g in sodium chloride 0.9 % 100 mL IVPB  Status:  Discontinued     3 g 100 mL/hr over 60 Minutes Intravenous Every 6 hours 09/22/16 1437 09/22/16 1439   09/22/16 1600  ciprofloxacin (CIPRO) IVPB 400 mg     400 mg 200 mL/hr over 60 Minutes Intravenous Every 12 hours 09/22/16 1439     09/22/16 1500  metroNIDAZOLE (FLAGYL) tablet 500 mg     500 mg Oral Every 8 hours 09/22/16 1439     09/22/16 1200  meropenem (MERREM) 1 g in sodium chloride 0.9 % 100 mL IVPB  Status:  Discontinued     1 g 200 mL/hr over 30 Minutes Intravenous Every 8 hours 09/22/16 1150 09/22/16 1420   09/21/16 1800  Ampicillin-Sulbactam (UNASYN) 3 g in sodium chloride 0.9 % 100 mL IVPB  Status:  Discontinued     3 g 100 mL/hr over 60 Minutes Intravenous Every 6 hours 09/21/16 1456 09/22/16 1150   09/19/16 1000  azithromycin (ZITHROMAX) tablet 250 mg     250 mg Oral Daily 09/18/16 1424 09/22/16  0920   09/18/16 2200  piperacillin-tazobactam (ZOSYN) IVPB 3.375 g  Status:  Discontinued     3.375 g 12.5 mL/hr over 240 Minutes Intravenous Every 8 hours 09/18/16 1303 09/18/16 1317   09/18/16 1430  azithromycin (ZITHROMAX) tablet 500 mg     500 mg Oral Daily 09/18/16 1424 09/18/16 1537   09/18/16 1400  meropenem (MERREM) 1 g in sodium chloride 0.9 % 100 mL IVPB  Status:  Discontinued     1 g 200 mL/hr over 30 Minutes Intravenous Every 8 hours 09/18/16 1319 09/21/16 1456   09/17/16 1800  piperacillin-tazobactam (ZOSYN) IVPB 3.375 g  Status:  Discontinued     3.375 g 12.5 mL/hr over 240 Minutes Intravenous Every 6 hours 09/17/16 1457 09/18/16 1303   09/17/16 0945  piperacillin-tazobactam (ZOSYN) IVPB 3.375 g     3.375 g 12.5 mL/hr over 240 Minutes Intravenous  Once 09/17/16 0939 09/17/16 1043      Assessment/Plan:  20 year old male admitted initially with acute cholecystitis now being treated for Klebsiella bacteremia as well as multilobar pneumonia. Pain cc was obtained today secondary to fevers. 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 Continues to have CT changes consistent with pneumonia on scan. No evidence of empyema or abscess anywhere else. Overall, patient is improving clinically with the exception of his persistent intermittent fevers. Appreciate critical care, pulmonary, infectious disease assistance with this complicated patient. At this point would not recommend going after the likely biloma, however this  can be followed with an ultrasound per the radiologist on Monday. If the fluid collection persists a could be aspirated at that time. Encourage ambulation, incentive spirometer usage, oral intake.  Iram Lundberg T. Tonita Cong, MD, FACS  09/23/2016

## 2016-09-24 DIAGNOSIS — R5081 Fever presenting with conditions classified elsewhere: Secondary | ICD-10-CM

## 2016-09-24 LAB — CBC
HEMATOCRIT: 44.1 % (ref 40.0–52.0)
HEMOGLOBIN: 15.2 g/dL (ref 13.0–18.0)
MCH: 30.1 pg (ref 26.0–34.0)
MCHC: 34.4 g/dL (ref 32.0–36.0)
MCV: 87.4 fL (ref 80.0–100.0)
Platelets: 253 10*3/uL (ref 150–440)
RBC: 5.05 MIL/uL (ref 4.40–5.90)
RDW: 12.9 % (ref 11.5–14.5)
WBC: 7.3 10*3/uL (ref 3.8–10.6)

## 2016-09-24 LAB — BASIC METABOLIC PANEL
ANION GAP: 8 (ref 5–15)
BUN: 10 mg/dL (ref 6–20)
CHLORIDE: 96 mmol/L — AB (ref 101–111)
CO2: 34 mmol/L — AB (ref 22–32)
CREATININE: 0.85 mg/dL (ref 0.61–1.24)
Calcium: 8.5 mg/dL — ABNORMAL LOW (ref 8.9–10.3)
GFR calc non Af Amer: >60 mL/min (ref >60)
Glucose, Bld: 101 mg/dL — ABNORMAL HIGH (ref 65–99)
POTASSIUM: 3.2 mmol/L — AB (ref 3.5–5.1)
Sodium: 138 mmol/L (ref 135–145)

## 2016-09-24 LAB — MAGNESIUM: Magnesium: 2.4 mg/dL (ref 1.7–2.4)

## 2016-09-24 LAB — PHOSPHORUS: Phosphorus: 4.1 mg/dL (ref 2.5–4.6)

## 2016-09-24 NOTE — Progress Notes (Signed)
Patient being transferred out of ICU  Admitted by surgery for a calculus cholecystitis with severe sepsis. Later developed multilobar pneumonia likely due to aspiration. Also bacteremia. Had a percutaneous cholecystostomy due to being acutely ill.  Being transferred out of ICU and will need to be followed by hospitalist. Discussed with Dr. Dema SeverinMungal.

## 2016-09-24 NOTE — Progress Notes (Signed)
Pt being transferred to room 205. Report called to Leigh. Pt and belongings transferred to room 205 without incident.

## 2016-09-24 NOTE — Progress Notes (Signed)
CC: Cholecystitis Subjective: Patient reports feeling better today. He did not spike a fever last night but he was treated with ibuprofen. He's been tolerating a diet. He denies any pain and states he is breathing better.  Objective: Vital signs in last 24 hours: Temp:  [98 F (36.7 C)-100 F (37.8 C)] 98.9 F (37.2 C) (10/15 0800) Pulse Rate:  [65-109] 91 (10/15 0900) Resp:  [15-34] 23 (10/15 0900) BP: (99-137)/(23-84) 99/23 (10/15 0800) SpO2:  [95 %-99 %] 97 % (10/15 0900) Weight:  [91.6 kg (201 lb 15.1 oz)] 91.6 kg (201 lb 15.1 oz) (10/15 0400) Last BM Date: 09/24/16  Intake/Output from previous day: 10/14 0701 - 10/15 0700 In: 660 [P.O.:260; IV Piggyback:400] Out: 1575 [Urine:1200; Drains:375] Intake/Output this shift: Total I/O In: 70 [P.O.:70] Out: -   Physical exam:  Gen.: No acute distress Chest: Coarse breath sounds throughout worse on the right Heart: Regular rate and rhythm Abdomen: Soft, nontender, nondistended. Cholecystostomy tube in place draining bile.  Lab Results: CBC   Recent Labs  09/23/16 0354 09/24/16 0500  WBC 6.7 7.3  HGB 14.3 15.2  HCT 41.8 44.1  PLT 195 253   BMET  Recent Labs  09/23/16 0354 09/24/16 0500  NA 135 138  K 3.3* 3.2*  CL 94* 96*  CO2 32 34*  GLUCOSE 122* 101*  BUN 12 10  CREATININE 0.89 0.85  CALCIUM 8.3* 8.5*   PT/INR No results for input(s): LABPROT, INR in the last 72 hours. ABG No results for input(s): PHART, HCO3 in the last 72 hours.  Invalid input(s): PCO2, PO2  Studies/Results: Dg Chest 1 View  Result Date: 09/23/2016 CLINICAL DATA:  Pneumonia EXAM: CHEST 1 VIEW COMPARISON:  Multiple recent previous exams. FINDINGS: 0524 hours. Low lung volumes with stable asymmetric elevation right hemidiaphragm. The diffuse interstitial and airspace disease seen previously has improved in the interval with some patchy bilateral airspace disease persisting with slight central predominance. Cardiopericardial  silhouette is at upper limits of normal for size. The visualized bony structures of the thorax are intact. Telemetry leads overlie the chest. IMPRESSION: Interval improvement in lung aeration, suggesting resolving edema. Electronically Signed   By: Kennith Center M.D.   On: 09/23/2016 08:35   Dg Abd 1 View  Result Date: 09/23/2016 CLINICAL DATA:  Acute cholecystitis EXAM: ABDOMEN - 1 VIEW COMPARISON:  None. FINDINGS: Percutaneous drainage catheter overlies the right abdomen. Prominent gastric bubble. Air is stool noted in the colon. IMPRESSION: No evidence of bowel obstruction. Electronically Signed   By: Kennith Center M.D.   On: 09/23/2016 08:37   Ct Chest W Contrast  Result Date: 09/23/2016 CLINICAL DATA:  Follow-up pneumonia.  Follow-up colostomy 2. EXAM: CT CHEST, ABDOMEN, AND PELVIS WITH CONTRAST TECHNIQUE: Multidetector CT imaging of the chest, abdomen and pelvis was performed following the standard protocol during bolus administration of intravenous contrast. CONTRAST:  ISOVUE-300 IOPAMIDOL (ISOVUE-300) INJECTION 61% COMPARISON:  CT of the chest September 18, 2016 and CT the abdomen/ pelvis September 17, 2016 FINDINGS: CT CHEST FINDINGS Cardiovascular: No significant vascular findings. Normal heart size. No pericardial effusion. Mediastinum/Nodes: No enlarged mediastinal, hilar, or axillary lymph nodes. Thyroid gland, trachea, and esophagus demonstrate no significant findings. Lungs/Pleura: The central airways are normal. No pneumothorax. Bilateral patchy infiltrates remain. Overall, there has been significant interval improvement. Opacity posteriorly in the left base on series 4, image 100 is more focal in the interval. Musculoskeletal: No chest wall mass or suspicious bone lesions identified. CT ABDOMEN PELVIS FINDINGS Hepatobiliary: A  cholecystostomy tube is seen in the gallbladder. Surrounding stranding identified. There is a fluid collection adjacent to the inferior right hepatic lobe on axial  image 54 measuring 4.6 by 4.2 by 3.0 cm. This extends towards the inferior aspect of the gallbladder. No liver masses. The portal vein is normal. Pancreas: Unremarkable. No pancreatic ductal dilatation or surrounding inflammatory changes. Spleen: Normal in size without focal abnormality. Adrenals/Urinary Tract: Adrenal glands are unremarkable. Kidneys are normal, without renal calculi, focal lesion, or hydronephrosis. Bladder is unremarkable. Stomach/Bowel: Stomach is within normal limits. Appendix appears normal. No evidence of bowel wall thickening, distention, or inflammatory changes. Vascular/Lymphatic: No significant vascular findings are present. No enlarged abdominal or pelvic lymph nodes. Reproductive: Prostate is unremarkable. Other: No free air.  No other significant abnormalities. Musculoskeletal: No acute or significant osseous findings. IMPRESSION: 1. Bilateral patchy pulmonary infiltrates have overall improved in the interval but significant infiltrate remains. Focal infiltrate in the posterior inferior left lung base has actually become more focal in the interval. Recommend follow-up to complete resolution. 2. A cholecystostomy tube is identified. 3. There is a fluid collection adjacent to the undersurface of the right hepatic lobe which extends towards the inferior gallbladder. The findings are nonspecific but an abscess is not excluded. Leakage from the gallbladder resulting in a biloma would demonstrate a similar appearance. These results will be called to the ordering clinician or representative by the Radiologist Assistant, and communication documented in the PACS or zVision Dashboard. Electronically Signed   By: Gerome Sam III M.D   On: 09/23/2016 11:58   Ct Abdomen Pelvis W Contrast  Result Date: 09/23/2016 CLINICAL DATA:  Follow-up pneumonia.  Follow-up colostomy 2. EXAM: CT CHEST, ABDOMEN, AND PELVIS WITH CONTRAST TECHNIQUE: Multidetector CT imaging of the chest, abdomen and pelvis  was performed following the standard protocol during bolus administration of intravenous contrast. CONTRAST:  ISOVUE-300 IOPAMIDOL (ISOVUE-300) INJECTION 61% COMPARISON:  CT of the chest September 18, 2016 and CT the abdomen/ pelvis September 17, 2016 FINDINGS: CT CHEST FINDINGS Cardiovascular: No significant vascular findings. Normal heart size. No pericardial effusion. Mediastinum/Nodes: No enlarged mediastinal, hilar, or axillary lymph nodes. Thyroid gland, trachea, and esophagus demonstrate no significant findings. Lungs/Pleura: The central airways are normal. No pneumothorax. Bilateral patchy infiltrates remain. Overall, there has been significant interval improvement. Opacity posteriorly in the left base on series 4, image 100 is more focal in the interval. Musculoskeletal: No chest wall mass or suspicious bone lesions identified. CT ABDOMEN PELVIS FINDINGS Hepatobiliary: A cholecystostomy tube is seen in the gallbladder. Surrounding stranding identified. There is a fluid collection adjacent to the inferior right hepatic lobe on axial image 54 measuring 4.6 by 4.2 by 3.0 cm. This extends towards the inferior aspect of the gallbladder. No liver masses. The portal vein is normal. Pancreas: Unremarkable. No pancreatic ductal dilatation or surrounding inflammatory changes. Spleen: Normal in size without focal abnormality. Adrenals/Urinary Tract: Adrenal glands are unremarkable. Kidneys are normal, without renal calculi, focal lesion, or hydronephrosis. Bladder is unremarkable. Stomach/Bowel: Stomach is within normal limits. Appendix appears normal. No evidence of bowel wall thickening, distention, or inflammatory changes. Vascular/Lymphatic: No significant vascular findings are present. No enlarged abdominal or pelvic lymph nodes. Reproductive: Prostate is unremarkable. Other: No free air.  No other significant abnormalities. Musculoskeletal: No acute or significant osseous findings. IMPRESSION: 1. Bilateral  patchy pulmonary infiltrates have overall improved in the interval but significant infiltrate remains. Focal infiltrate in the posterior inferior left lung base has actually become more focal in the  interval. Recommend follow-up to complete resolution. 2. A cholecystostomy tube is identified. 3. There is a fluid collection adjacent to the undersurface of the right hepatic lobe which extends towards the inferior gallbladder. The findings are nonspecific but an abscess is not excluded. Leakage from the gallbladder resulting in a biloma would demonstrate a similar appearance. These results will be called to the ordering clinician or representative by the Radiologist Assistant, and communication documented in the PACS or zVision Dashboard. Electronically Signed   By: Gerome Samavid  Williams III M.D   On: 09/23/2016 11:58    Anti-infectives: Anti-infectives    Start     Dose/Rate Route Frequency Ordered Stop   09/22/16 1800  Ampicillin-Sulbactam (UNASYN) 3 g in sodium chloride 0.9 % 100 mL IVPB  Status:  Discontinued     3 g 100 mL/hr over 60 Minutes Intravenous Every 6 hours 09/22/16 1437 09/22/16 1439   09/22/16 1600  ciprofloxacin (CIPRO) IVPB 400 mg     400 mg 200 mL/hr over 60 Minutes Intravenous Every 12 hours 09/22/16 1439     09/22/16 1500  metroNIDAZOLE (FLAGYL) tablet 500 mg     500 mg Oral Every 8 hours 09/22/16 1439     09/22/16 1200  meropenem (MERREM) 1 g in sodium chloride 0.9 % 100 mL IVPB  Status:  Discontinued     1 g 200 mL/hr over 30 Minutes Intravenous Every 8 hours 09/22/16 1150 09/22/16 1420   09/21/16 1800  Ampicillin-Sulbactam (UNASYN) 3 g in sodium chloride 0.9 % 100 mL IVPB  Status:  Discontinued     3 g 100 mL/hr over 60 Minutes Intravenous Every 6 hours 09/21/16 1456 09/22/16 1150   09/19/16 1000  azithromycin (ZITHROMAX) tablet 250 mg     250 mg Oral Daily 09/18/16 1424 09/22/16 0920   09/18/16 2200  piperacillin-tazobactam (ZOSYN) IVPB 3.375 g  Status:  Discontinued     3.375  g 12.5 mL/hr over 240 Minutes Intravenous Every 8 hours 09/18/16 1303 09/18/16 1317   09/18/16 1430  azithromycin (ZITHROMAX) tablet 500 mg     500 mg Oral Daily 09/18/16 1424 09/18/16 1537   09/18/16 1400  meropenem (MERREM) 1 g in sodium chloride 0.9 % 100 mL IVPB  Status:  Discontinued     1 g 200 mL/hr over 30 Minutes Intravenous Every 8 hours 09/18/16 1319 09/21/16 1456   09/17/16 1800  piperacillin-tazobactam (ZOSYN) IVPB 3.375 g  Status:  Discontinued     3.375 g 12.5 mL/hr over 240 Minutes Intravenous Every 6 hours 09/17/16 1457 09/18/16 1303   09/17/16 0945  piperacillin-tazobactam (ZOSYN) IVPB 3.375 g     3.375 g 12.5 mL/hr over 240 Minutes Intravenous  Once 09/17/16 0939 09/17/16 1043      Assessment/Plan:  20 year old male admitted with acute cholecystitis. Appreciate critical care, pulmonology, infectious disease assistance with this complex situation. Cholecystitis treated with cholecystostomy tube. Currently being treated for multilobar pneumonia and bacteremia. Plan to transfer out of the ICU today. Encourage ambulation, incentive spirometer usage, oral intake. He will need to go 24 hours without a fever and be transitioned to oral antibiotics prior to discharge.  Adoni Greenough T. Tonita CongWoodham, MD, FACS  09/24/2016

## 2016-09-24 NOTE — Progress Notes (Signed)
Pt was able to ambulate in the room.  He did say he was feeling a little dizzy.  Pt assisted to the chair.  Mom and step dad are very supportive

## 2016-09-24 NOTE — Progress Notes (Signed)
[facility] [place] [facility] Consultation      Assessment and Plan:  A: -Cholecystitis s/p cholecystomy tube  -Acute hypoxemic respiratory failure. RLL pneumonia, likely related to underlying acute cholecystitis. -Sepsis, secondary to above. -Right lower lobe atelectasis with elevated right sided diaphragm and RLL PNA - secondary to acute cholecystitis -History of autism. - Fevers - secondary to sepsis - Bacteremia - Klebsiella pneumonia - clinically improving - Biloma around the the gallbladder  P: s/p ultrasound guided cholecystostomy tube placement by IR on 10/11 Broad-spectrum antibiotics (Meropenem and Azithro)  Azithromycin  Meropenem 10/10>10/12, restarted 10/13  Unasyn 10/12>10/13  Bld Cx 10/8> Klebsiella pneumonia  Chole Tube Cx 10/11> Recheck blood cultures 10/13>NTD ID recs appreciated.  CTA chest with no PE on 10/9 Wean HFNC as tolerated Maintain O2 sats > 88% Agitation - on low dose precedex gtt - now off Multilobar PNA - now improving clinically, cont with abx as stated above, will need f/u CXR as outpatient, this can be with PMD. Finally abx recs to be determine ID.   Thank you for consulting [facility], we will signoff at this time.  Please feel free to contact us with any questions at [phone] (please enter [phone]).   Date: 09/24/2016  MRN# [id number] NAIN RUDD 05/13/1996  Referring Physician: Dr. Elpidio Anis.   RAFIEL MECCA is a 20 y.o. old male seen in consultation for chief complaint of:    Chief Complaint  Patient presents with  . Abdominal Pain    HPI:  Patient is a 20 year old male, he is accompanied by his mother. Patient presented to the hospital with increasing right upper quadrant abdominal pain. CT abdomen was performed on 09/17/16, suggestive of acute cholecystitis. Over the last 24 hours. Patient's oxygen saturations have dropped initially was on room air. Subsequently, this has been increased to 6 L via  nasal cannula. The patient complains of mild dyspnea, however, he does not look to be in any respiratory distress.  On review of the patient's imaging. There does appear to be an elevated right-sided diaphragm, possible new right sided infiltrates, when combined with the presence of fever. This may be suggestive of pneumonia.  SUBJ: No fevers overnight, back to solid diet, and tolerating well   PMHX:   Past Medical History:  Diagnosis Date  . Autism   . Irritable bowel syndrome    Surgical Hx:  Past Surgical History:  Procedure Laterality Date  . EYE SURGERY    . IR GENERIC HISTORICAL  09/20/2016   IR PERC CHOLECYSTOSTOMY 09/20/2016 [name], MD [facility]-INTERV RAD   Family Hx:  History reviewed. No pertinent family history. Social Hx:   Social History  Substance Use Topics  . Smoking status: Never Smoker  . Smokeless tobacco: Never Used  . Alcohol use Not on file   Medication:       Allergies:  Review of patient's allergies indicates no known allergies.  Review of Systems: Gen: admits  fever, sweats, chills Cvc:  No dizziness, chest pain. Resp:   Denies cough or sputum production Gi: Denies swallowing difficulty,  Admits mild abd pain, but improving Gu:  Denies bladder incontinence, burning urine Ext:   No Joint pain, stiffness. Skin: No skin rash,  hives  Endoc:  No polyuria, polydipsia. Psych: No depression, insomnia. Other:  All other systems were reviewed with the patient and were negative other that what is mentioned in the HPI.   Physical Examination:   VS: BP 115/77 (BP Location:  Right Arm)   Pulse 84   Temp 98 F (36.7 C) (Oral)   Resp (!) 25   Ht 5' (1.524 m)   Wt 201 lb 15.1 oz (91.6 kg)   SpO2 97%   BMI 39.44 kg/m   General Appearance: No distress  Neuro:without focal findings,  speech normal,  HEENT: PERRLA, EOM intact.   Pulmonary: normal breath sounds, No wheezing. Decreased air entry in both lung bases. tachypnea CardiovascularNormal  S1,S2.  No m/r/g.   Abdomen: Benign, Soft, moderate RUQ tenderness Renal:  No costovertebral tenderness  GU:  No performed at this time. Endoc: No evident thyromegaly, no signs of acromegaly. Skin:   warm, no rashes, no ecchymosis  Extremities: normal, no cyanosis, clubbing.  Other findings:    LABORATORY PANEL:   CBC  Recent Labs Lab 09/24/16 0500  WBC 7.3  HGB 15.2  HCT 44.1  PLT 253   ------------------------------------------------------------------------------------------------------------------  Chemistries   Recent Labs Lab 09/20/16 1421  09/24/16 0500  NA 130*  < > 138  K 3.8  < > 3.2*  CL 98*  < > 96*  CO2 23  < > 34*  GLUCOSE 113*  < > 101*  BUN 11  < > 10  CREATININE 0.97  < > 0.85  CALCIUM 8.1*  < > 8.5*  MG  --   < > 2.4  AST 35  --   --   ALT 31  --   --   ALKPHOS 62  --   --   BILITOT 1.3*  --   --   < > = values in this interval not displayed. ------------------------------------------------------------------------------------------------------------------  Cardiac Enzymes  Recent Labs Lab 09/17/16 0918  TROPONINI <0.03   ------------------------------------------------------------  RADIOLOGY:  Dg Chest 1 View  Result Date: 09/23/2016 CLINICAL DATA:  Pneumonia EXAM: CHEST 1 VIEW COMPARISON:  Multiple recent previous exams. FINDINGS: 0524 hours. Low lung volumes with stable asymmetric elevation right hemidiaphragm. The diffuse interstitial and airspace disease seen previously has improved in the interval with some patchy bilateral airspace disease persisting with slight central predominance. Cardiopericardial silhouette is at upper limits of normal for size. The visualized bony structures of the thorax are intact. Telemetry leads overlie the chest. IMPRESSION: Interval improvement in lung aeration, suggesting resolving edema. Electronically Signed   By: Kennith Center M.D.   On: 09/23/2016 08:35   Dg Abd 1 View  Result Date:  09/23/2016 CLINICAL DATA:  Acute cholecystitis EXAM: ABDOMEN - 1 VIEW COMPARISON:  None. FINDINGS: Percutaneous drainage catheter overlies the right abdomen. Prominent gastric bubble. Air is stool noted in the colon. IMPRESSION: No evidence of bowel obstruction. Electronically Signed   By: Kennith Center M.D.   On: 09/23/2016 08:37   Ct Chest W Contrast  Result Date: 09/23/2016 CLINICAL DATA:  Follow-up pneumonia.  Follow-up colostomy 2. EXAM: CT CHEST, ABDOMEN, AND PELVIS WITH CONTRAST TECHNIQUE: Multidetector CT imaging of the chest, abdomen and pelvis was performed following the standard protocol during bolus administration of intravenous contrast. CONTRAST:  ISOVUE-300 IOPAMIDOL (ISOVUE-300) INJECTION 61% COMPARISON:  CT of the chest September 18, 2016 and CT the abdomen/ pelvis September 17, 2016 FINDINGS: CT CHEST FINDINGS Cardiovascular: No significant vascular findings. Normal heart size. No pericardial effusion. Mediastinum/Nodes: No enlarged mediastinal, hilar, or axillary lymph nodes. Thyroid gland, trachea, and esophagus demonstrate no significant findings. Lungs/Pleura: The central airways are normal. No pneumothorax. Bilateral patchy infiltrates remain. Overall, there has been significant interval improvement. Opacity posteriorly in the left base on  series 4, image 100 is more focal in the interval. Musculoskeletal: No chest wall mass or suspicious bone lesions identified. CT ABDOMEN PELVIS FINDINGS Hepatobiliary: A cholecystostomy tube is seen in the gallbladder. Surrounding stranding identified. There is a fluid collection adjacent to the inferior right hepatic lobe on axial image 54 measuring 4.6 by 4.2 by 3.0 cm. This extends towards the inferior aspect of the gallbladder. No liver masses. The portal vein is normal. Pancreas: Unremarkable. No pancreatic ductal dilatation or surrounding inflammatory changes. Spleen: Normal in size without focal abnormality. Adrenals/Urinary Tract: Adrenal  glands are unremarkable. Kidneys are normal, without renal calculi, focal lesion, or hydronephrosis. Bladder is unremarkable. Stomach/Bowel: Stomach is within normal limits. Appendix appears normal. No evidence of bowel wall thickening, distention, or inflammatory changes. Vascular/Lymphatic: No significant vascular findings are present. No enlarged abdominal or pelvic lymph nodes. Reproductive: Prostate is unremarkable. Other: No free air.  No other significant abnormalities. Musculoskeletal: No acute or significant osseous findings. IMPRESSION: 1. Bilateral patchy pulmonary infiltrates have overall improved in the interval but significant infiltrate remains. Focal infiltrate in the posterior inferior left lung base has actually become more focal in the interval. Recommend follow-up to complete resolution. 2. A cholecystostomy tube is identified. 3. There is a fluid collection adjacent to the undersurface of the right hepatic lobe which extends towards the inferior gallbladder. The findings are nonspecific but an abscess is not excluded. Leakage from the gallbladder resulting in a biloma would demonstrate a similar appearance. These results will be called to the ordering clinician or representative by the Radiologist Assistant, and communication documented in the PACS or zVision Dashboard. Electronically Signed   By: Gerome Sam III M.D   On: 09/23/2016 11:58   Ct Abdomen Pelvis W Contrast  Result Date: 09/23/2016 CLINICAL DATA:  Follow-up pneumonia.  Follow-up colostomy 2. EXAM: CT CHEST, ABDOMEN, AND PELVIS WITH CONTRAST TECHNIQUE: Multidetector CT imaging of the chest, abdomen and pelvis was performed following the standard protocol during bolus administration of intravenous contrast. CONTRAST:  ISOVUE-300 IOPAMIDOL (ISOVUE-300) INJECTION 61% COMPARISON:  CT of the chest September 18, 2016 and CT the abdomen/ pelvis September 17, 2016 FINDINGS: CT CHEST FINDINGS Cardiovascular: No significant vascular  findings. Normal heart size. No pericardial effusion. Mediastinum/Nodes: No enlarged mediastinal, hilar, or axillary lymph nodes. Thyroid gland, trachea, and esophagus demonstrate no significant findings. Lungs/Pleura: The central airways are normal. No pneumothorax. Bilateral patchy infiltrates remain. Overall, there has been significant interval improvement. Opacity posteriorly in the left base on series 4, image 100 is more focal in the interval. Musculoskeletal: No chest wall mass or suspicious bone lesions identified. CT ABDOMEN PELVIS FINDINGS Hepatobiliary: A cholecystostomy tube is seen in the gallbladder. Surrounding stranding identified. There is a fluid collection adjacent to the inferior right hepatic lobe on axial image 54 measuring 4.6 by 4.2 by 3.0 cm. This extends towards the inferior aspect of the gallbladder. No liver masses. The portal vein is normal. Pancreas: Unremarkable. No pancreatic ductal dilatation or surrounding inflammatory changes. Spleen: Normal in size without focal abnormality. Adrenals/Urinary Tract: Adrenal glands are unremarkable. Kidneys are normal, without renal calculi, focal lesion, or hydronephrosis. Bladder is unremarkable. Stomach/Bowel: Stomach is within normal limits. Appendix appears normal. No evidence of bowel wall thickening, distention, or inflammatory changes. Vascular/Lymphatic: No significant vascular findings are present. No enlarged abdominal or pelvic lymph nodes. Reproductive: Prostate is unremarkable. Other: No free air.  No other significant abnormalities. Musculoskeletal: No acute or significant osseous findings. IMPRESSION: 1. Bilateral patchy pulmonary infiltrates have  overall improved in the interval but significant infiltrate remains. Focal infiltrate in the posterior inferior left lung base has actually become more focal in the interval. Recommend follow-up to complete resolution. 2. A cholecystostomy tube is identified. 3. There is a fluid collection  adjacent to the undersurface of the right hepatic lobe which extends towards the inferior gallbladder. The findings are nonspecific but an abscess is not excluded. Leakage from the gallbladder resulting in a biloma would demonstrate a similar appearance. These results will be called to the ordering clinician or representative by the Radiologist Assistant, and communication documented in the PACS or zVision Dashboard. Electronically Signed   By: Gerome Samavid  Williams III M.D   On: 09/23/2016 11:58     Thank  you for the consultation and for allowing [facility] to assist in the care of your patient. Our recommendations are noted above.  Please contact us if we can be of further service.  PCCM Time - 35 mins  Stephanie AcreVishal Klay Sobotka, MD [facility] Pager [phone] (please enter 7-digits) On Call Pager - [phone] (please enter 7-digits)   09/24/2016

## 2016-09-25 LAB — AEROBIC/ANAEROBIC CULTURE (SURGICAL/DEEP WOUND)

## 2016-09-25 LAB — AEROBIC/ANAEROBIC CULTURE W GRAM STAIN (SURGICAL/DEEP WOUND)

## 2016-09-25 MED ORDER — SODIUM CHLORIDE 0.9 % IR SOLN
5.0000 mL | Freq: Three times a day (TID) | Status: DC
  Administered 2016-09-25 – 2016-09-26 (×2): 5 mL

## 2016-09-25 MED ORDER — POTASSIUM CHLORIDE CRYS ER 20 MEQ PO TBCR
40.0000 meq | EXTENDED_RELEASE_TABLET | Freq: Once | ORAL | Status: AC
  Administered 2016-09-25: 40 meq via ORAL
  Filled 2016-09-25: qty 2

## 2016-09-25 MED ORDER — CIPROFLOXACIN HCL 500 MG PO TABS
500.0000 mg | ORAL_TABLET | Freq: Two times a day (BID) | ORAL | Status: DC
  Administered 2016-09-25 – 2016-09-26 (×2): 500 mg via ORAL
  Filled 2016-09-25 (×2): qty 1

## 2016-09-25 NOTE — Progress Notes (Signed)
CC: Acute cholecystitis Subjective: This a patient with a history of acute cholecystitis for which a cholecystostomy tube has been placed. He had a klebsiella septicemia and is currently on IV antibiotics. The patient currently has no complaints is tolerating a regular diet no further fevers or chills. I saw the patient with prime doc. We discussed his disposition in the future.  Objective: Vital signs in last 24 hours: Temp:  [98.5 F (36.9 C)-99.5 F (37.5 C)] 98.8 F (37.1 C) (10/16 0833) Pulse Rate:  [83-92] 92 (10/16 0833) Resp:  [20-30] 20 (10/16 0835) BP: (118-129)/(60-73) 125/73 (10/16 0833) SpO2:  [94 %-99 %] 96 % (10/16 0833) Last BM Date: 09/24/16  Intake/Output from previous day: 10/15 0701 - 10/16 0700 In: 75 [P.O.:70] Out: 755 [Urine:550; Drains:205] Intake/Output this shift: No intake/output data recorded.  Physical exam:  Vital signs reviewed afebrile. Comfortable-appearing with nasal prong oxygen. Abdomen is soft nondistended nontender right upper quadrant or lateral cholecystostomy tube draining bilious clear fluid. Calves are nontender No icterus no jaundice  Lab Results: CBC   Recent Labs  09/23/16 0354 09/24/16 0500  WBC 6.7 7.3  HGB 14.3 15.2  HCT 41.8 44.1  PLT 195 253   BMET  Recent Labs  09/23/16 0354 09/24/16 0500  NA 135 138  K 3.3* 3.2*  CL 94* 96*  CO2 32 34*  GLUCOSE 122* 101*  BUN 12 10  CREATININE 0.89 0.85  CALCIUM 8.3* 8.5*   PT/INR No results for input(s): LABPROT, INR in the last 72 hours. ABG No results for input(s): PHART, HCO3 in the last 72 hours.  Invalid input(s): PCO2, PO2  Studies/Results: Ct Chest W Contrast  Result Date: 09/23/2016 CLINICAL DATA:  Follow-up pneumonia.  Follow-up colostomy 2. EXAM: CT CHEST, ABDOMEN, AND PELVIS WITH CONTRAST TECHNIQUE: Multidetector CT imaging of the chest, abdomen and pelvis was performed following the standard protocol during bolus administration of intravenous  contrast. CONTRAST:  100mL ISOVUE-300 IOPAMIDOL (ISOVUE-300) INJECTION 61% COMPARISON:  CT of the chest September 18, 2016 and CT the abdomen/ pelvis September 17, 2016 FINDINGS: CT CHEST FINDINGS Cardiovascular: No significant vascular findings. Normal heart size. No pericardial effusion. Mediastinum/Nodes: No enlarged mediastinal, hilar, or axillary lymph nodes. Thyroid gland, trachea, and esophagus demonstrate no significant findings. Lungs/Pleura: The central airways are normal. No pneumothorax. Bilateral patchy infiltrates remain. Overall, there has been significant interval improvement. Opacity posteriorly in the left base on series 4, image 100 is more focal in the interval. Musculoskeletal: No chest wall mass or suspicious bone lesions identified. CT ABDOMEN PELVIS FINDINGS Hepatobiliary: A cholecystostomy tube is seen in the gallbladder. Surrounding stranding identified. There is a fluid collection adjacent to the inferior right hepatic lobe on axial image 54 measuring 4.6 by 4.2 by 3.0 cm. This extends towards the inferior aspect of the gallbladder. No liver masses. The portal vein is normal. Pancreas: Unremarkable. No pancreatic ductal dilatation or surrounding inflammatory changes. Spleen: Normal in size without focal abnormality. Adrenals/Urinary Tract: Adrenal glands are unremarkable. Kidneys are normal, without renal calculi, focal lesion, or hydronephrosis. Bladder is unremarkable. Stomach/Bowel: Stomach is within normal limits. Appendix appears normal. No evidence of bowel wall thickening, distention, or inflammatory changes. Vascular/Lymphatic: No significant vascular findings are present. No enlarged abdominal or pelvic lymph nodes. Reproductive: Prostate is unremarkable. Other: No free air.  No other significant abnormalities. Musculoskeletal: No acute or significant osseous findings. IMPRESSION: 1. Bilateral patchy pulmonary infiltrates have overall improved in the interval but significant infiltrate  remains. Focal infiltrate in the posterior  inferior left lung base has actually become more focal in the interval. Recommend follow-up to complete resolution. 2. A cholecystostomy tube is identified. 3. There is a fluid collection adjacent to the undersurface of the right hepatic lobe which extends towards the inferior gallbladder. The findings are nonspecific but an abscess is not excluded. Leakage from the gallbladder resulting in a biloma would demonstrate a similar appearance. These results will be called to the ordering clinician or representative by the Radiologist Assistant, and communication documented in the PACS or zVision Dashboard. Electronically Signed   By: Gerome Sam III M.D   On: 09/23/2016 11:58   Ct Abdomen Pelvis W Contrast  Result Date: 09/23/2016 CLINICAL DATA:  Follow-up pneumonia.  Follow-up colostomy 2. EXAM: CT CHEST, ABDOMEN, AND PELVIS WITH CONTRAST TECHNIQUE: Multidetector CT imaging of the chest, abdomen and pelvis was performed following the standard protocol during bolus administration of intravenous contrast. CONTRAST:  ISOVUE-300 IOPAMIDOL (ISOVUE-300) INJECTION 61% COMPARISON:  CT of the chest September 18, 2016 and CT the abdomen/ pelvis September 17, 2016 FINDINGS: CT CHEST FINDINGS Cardiovascular: No significant vascular findings. Normal heart size. No pericardial effusion. Mediastinum/Nodes: No enlarged mediastinal, hilar, or axillary lymph nodes. Thyroid gland, trachea, and esophagus demonstrate no significant findings. Lungs/Pleura: The central airways are normal. No pneumothorax. Bilateral patchy infiltrates remain. Overall, there has been significant interval improvement. Opacity posteriorly in the left base on series 4, image 100 is more focal in the interval. Musculoskeletal: No chest wall mass or suspicious bone lesions identified. CT ABDOMEN PELVIS FINDINGS Hepatobiliary: A cholecystostomy tube is seen in the gallbladder. Surrounding stranding identified. There  is a fluid collection adjacent to the inferior right hepatic lobe on axial image 54 measuring 4.6 by 4.2 by 3.0 cm. This extends towards the inferior aspect of the gallbladder. No liver masses. The portal vein is normal. Pancreas: Unremarkable. No pancreatic ductal dilatation or surrounding inflammatory changes. Spleen: Normal in size without focal abnormality. Adrenals/Urinary Tract: Adrenal glands are unremarkable. Kidneys are normal, without renal calculi, focal lesion, or hydronephrosis. Bladder is unremarkable. Stomach/Bowel: Stomach is within normal limits. Appendix appears normal. No evidence of bowel wall thickening, distention, or inflammatory changes. Vascular/Lymphatic: No significant vascular findings are present. No enlarged abdominal or pelvic lymph nodes. Reproductive: Prostate is unremarkable. Other: No free air.  No other significant abnormalities. Musculoskeletal: No acute or significant osseous findings. IMPRESSION: 1. Bilateral patchy pulmonary infiltrates have overall improved in the interval but significant infiltrate remains. Focal infiltrate in the posterior inferior left lung base has actually become more focal in the interval. Recommend follow-up to complete resolution. 2. A cholecystostomy tube is identified. 3. There is a fluid collection adjacent to the undersurface of the right hepatic lobe which extends towards the inferior gallbladder. The findings are nonspecific but an abscess is not excluded. Leakage from the gallbladder resulting in a biloma would demonstrate a similar appearance. These results will be called to the ordering clinician or representative by the Radiologist Assistant, and communication documented in the PACS or zVision Dashboard. Electronically Signed   By: Gerome Sam III M.D   On: 09/23/2016 11:58    Anti-infectives: Anti-infectives    Start     Dose/Rate Route Frequency Ordered Stop   09/22/16 1800  Ampicillin-Sulbactam (UNASYN) 3 g in sodium chloride  0.9 % 100 mL IVPB  Status:  Discontinued     3 g 100 mL/hr over 60 Minutes Intravenous Every 6 hours 09/22/16 1437 09/22/16 1439   09/22/16 1600  ciprofloxacin (CIPRO) IVPB  400 mg     400 mg 200 mL/hr over 60 Minutes Intravenous Every 12 hours 09/22/16 1439     09/22/16 1500  metroNIDAZOLE (FLAGYL) tablet 500 mg     500 mg Oral Every 8 hours 09/22/16 1439     09/22/16 1200  meropenem (MERREM) 1 g in sodium chloride 0.9 % 100 mL IVPB  Status:  Discontinued     1 g 200 mL/hr over 30 Minutes Intravenous Every 8 hours 09/22/16 1150 09/22/16 1420   09/21/16 1800  Ampicillin-Sulbactam (UNASYN) 3 g in sodium chloride 0.9 % 100 mL IVPB  Status:  Discontinued     3 g 100 mL/hr over 60 Minutes Intravenous Every 6 hours 09/21/16 1456 09/22/16 1150   09/19/16 1000  azithromycin (ZITHROMAX) tablet 250 mg     250 mg Oral Daily 09/18/16 1424 09/22/16 0920   09/18/16 2200  piperacillin-tazobactam (ZOSYN) IVPB 3.375 g  Status:  Discontinued     3.375 g 12.5 mL/hr over 240 Minutes Intravenous Every 8 hours 09/18/16 1303 09/18/16 1317   09/18/16 1430  azithromycin (ZITHROMAX) tablet 500 mg     500 mg Oral Daily 09/18/16 1424 09/18/16 1537   09/18/16 1400  meropenem (MERREM) 1 g in sodium chloride 0.9 % 100 mL IVPB  Status:  Discontinued     1 g 200 mL/hr over 30 Minutes Intravenous Every 8 hours 09/18/16 1319 09/21/16 1456   09/17/16 1800  piperacillin-tazobactam (ZOSYN) IVPB 3.375 g  Status:  Discontinued     3.375 g 12.5 mL/hr over 240 Minutes Intravenous Every 6 hours 09/17/16 1457 09/18/16 1303   09/17/16 0945  piperacillin-tazobactam (ZOSYN) IVPB 3.375 g     3.375 g 12.5 mL/hr over 240 Minutes Intravenous  Once 09/17/16 0939 09/17/16 1043      Assessment/Plan:  CT scan is personally reviewed. Agree with continuing IV antibiotics today if we are able to wean him off his oxygen and changing to oral antibiotics could be performed in the next 12-24 hours and sending him home with a cholecystostomy  tube for surgery at a later date. This is discussed with prime doc and with nursing and the patient.  Lattie Haw, MD, FACS  09/25/2016

## 2016-09-25 NOTE — Progress Notes (Signed)
Patient ID: Joshua [name] Shroff, male   DOB: 11-07-1996, 20 y.o.   MRN: [id number]  [facility] PROGRESS NOTE  Joshua [name] [place] WUJ:[id number] DOB: 11-07-1996 DOA: 09/17/2016 PCP: No PCP Per Patient  HPI/Subjective: Patient feels okay and offers no complaints. Tolerating diet. No fever.  Objective: Vitals:   09/25/16 0835 09/25/16 1108  BP:  130/71  Pulse:  91  Resp: 20 18  Temp:  97.3 F (36.3 C)    Filed Weights   09/22/16 0500 09/23/16 0358 09/24/16 0400  Weight: 96.5 kg (212 lb 11.9 oz) 96.3 kg (212 lb 4.9 oz) 91.6 kg (201 lb 15.1 oz)    ROS: Review of Systems  Constitutional: Negative for chills and fever.  Eyes: Negative for blurred vision.  Respiratory: Negative for cough and shortness of breath.   Cardiovascular: Negative for chest pain.  Gastrointestinal: Negative for abdominal pain, constipation, diarrhea, nausea and vomiting.  Genitourinary: Negative for dysuria.  Musculoskeletal: Negative for joint pain.  Neurological: Negative for dizziness and headaches.   Exam: Physical Exam  Constitutional: He is oriented to person, place, and time.  HENT:  Nose: No mucosal edema.  Mouth/Throat: No oropharyngeal exudate or posterior oropharyngeal edema.  Eyes: Conjunctivae, EOM and lids are normal. Pupils are equal, round, and reactive to light.  Neck: No JVD present. Carotid bruit is not present. No edema present. No thyroid mass and no thyromegaly present.  Cardiovascular: S1 normal and S2 normal.  Exam reveals no gallop.   No murmur heard. Pulses:      Dorsalis pedis pulses are 2+ on the right side, and 2+ on the left side.  Respiratory: No respiratory distress. He has no wheezes. He has no rhonchi. He has no rales.  GI: Soft. Bowel sounds are normal. There is no tenderness.  Musculoskeletal:       Right ankle: He exhibits no swelling.       Left ankle: He exhibits no swelling.  Lymphadenopathy:    He has no cervical adenopathy.  Neurological: He is alert and oriented  to person, place, and time. No cranial nerve deficit.  Skin: Skin is warm. No rash noted. Nails show no clubbing.  Psychiatric: He has a normal mood and affect.      Data Reviewed: Basic Metabolic Panel:  Recent Labs Lab 09/20/16 1421 09/21/16 0545 09/22/16 0522 09/23/16 0354 09/24/16 0500  NA 130* 133* 134* 135 138  K 3.8 3.5 3.7 3.3* 3.2*  CL 98* 96* 93* 94* 96*  CO2 23 26 32 32 34*  GLUCOSE 113* 97 103* 122* 101*  BUN 11 11 11 12 10   CREATININE 0.97 0.89 1.16 0.89 0.85  CALCIUM 8.1* 8.2* 8.2* 8.3* 8.5*  MG  --   --   --  2.0 2.4  PHOS  --   --   --  1.5* 4.1   Liver Function Tests:  Recent Labs Lab 09/19/16 0433 09/20/16 1421  AST 17 35  ALT 20 31  ALKPHOS 63 62  BILITOT 1.2 1.3*  PROT 6.0* 6.2*  ALBUMIN 2.9* 3.0*   CBC:  Recent Labs Lab 09/20/16 1421 09/21/16 0545 09/22/16 0522 09/23/16 0354 09/24/16 0500  WBC 7.9 5.9 6.3 6.7 7.3  NEUTROABS 6.8* 4.5  --   --   --   HGB 14.8 14.6 14.8 14.3 15.2  HCT 41.7 41.2 42.3 41.8 44.1  MCV 85.3 85.1 86.6 86.6 87.4  PLT 193 185 176 195 253   BNP (last 3 results)  Recent Labs  09/18/16  0601  BNP 75.0      Recent Results (from the past 240 hour(s))  Blood Culture (routine x 2)     Status: Abnormal   Collection Time: 09/17/16  9:44 AM  Result Value Ref Range Status   Specimen Description BLOOD LEFT ANTECUBITAL  Final   Special Requests BOTTLES DRAWN AEROBIC AND ANAEROBIC  10CC  Final   Culture  Setup Time   Final    GRAM NEGATIVE RODS IN BOTH AEROBIC AND ANAEROBIC BOTTLES CRITICAL RESULT CALLED TO, READ BACK BY AND VERIFIED WITH: MATT MCBANE AT 0302 ON 09/18/16 RWW CONFIRMED BY [facility] Performed at [facility]    Culture KLEBSIELLA PNEUMONIAE (A)  Final   Report Status 09/20/2016 FINAL  Final   Organism ID, Bacteria KLEBSIELLA PNEUMONIAE  Final      Susceptibility   Klebsiella pneumoniae - MIC*    AMPICILLIN >=32 RESISTANT Resistant     CEFAZOLIN <=4 SENSITIVE Sensitive     CEFEPIME <=1  SENSITIVE Sensitive     CEFTAZIDIME <=1 SENSITIVE Sensitive     CEFTRIAXONE <=1 SENSITIVE Sensitive     CIPROFLOXACIN <=0.25 SENSITIVE Sensitive     GENTAMICIN <=1 SENSITIVE Sensitive     IMIPENEM <=0.25 SENSITIVE Sensitive     TRIMETH/SULFA <=20 SENSITIVE Sensitive     AMPICILLIN/SULBACTAM 4 SENSITIVE Sensitive     PIP/TAZO <=4 SENSITIVE Sensitive     Extended ESBL NEGATIVE Sensitive     * KLEBSIELLA PNEUMONIAE  Blood Culture (routine x 2)     Status: Abnormal   Collection Time: 09/17/16  9:44 AM  Result Value Ref Range Status   Specimen Description BLOOD LEFT FOREARM  Final   Special Requests BOTTLES DRAWN AEROBIC AND ANAEROBIC  10CC  Final   Culture  Setup Time   Final    GRAM NEGATIVE RODS ANAEROBIC BOTTLE ONLY CRITICAL VALUE NOTED.  VALUE IS CONSISTENT WITH PREVIOUSLY REPORTED AND CALLED VALUE. CONFIRMED BY [facility]    Culture (A)  Final    KLEBSIELLA PNEUMONIAE SUSCEPTIBILITIES PERFORMED ON PREVIOUS CULTURE WITHIN THE LAST 5 DAYS. Performed at [facility]    Report Status 09/20/2016 FINAL  Final  Blood Culture ID Panel (Reflexed)     Status: Abnormal   Collection Time: 09/17/16  9:44 AM  Result Value Ref Range Status   Enterococcus species NOT DETECTED NOT DETECTED Final   Listeria monocytogenes NOT DETECTED NOT DETECTED Final   Staphylococcus species NOT DETECTED NOT DETECTED Final   Staphylococcus aureus NOT DETECTED NOT DETECTED Final   Streptococcus species NOT DETECTED NOT DETECTED Final   Streptococcus agalactiae NOT DETECTED NOT DETECTED Final   Streptococcus pneumoniae NOT DETECTED NOT DETECTED Final   Streptococcus pyogenes NOT DETECTED NOT DETECTED Final   Acinetobacter baumannii NOT DETECTED NOT DETECTED Final   Enterobacteriaceae species DETECTED (A) NOT DETECTED Final    Comment: CRITICAL RESULT CALLED TO, READ BACK BY AND VERIFIED WITH: MATT MCBANE AT 0302 ON 09/18/16 RWW    Enterobacter cloacae complex NOT DETECTED NOT DETECTED Final   Escherichia  coli NOT DETECTED NOT DETECTED Final   Klebsiella oxytoca NOT DETECTED NOT DETECTED Final   Klebsiella pneumoniae DETECTED (A) NOT DETECTED Final    Comment: CRITICAL RESULT CALLED TO, READ BACK BY AND VERIFIED WITH: MATT MCBANE AT 0302 ON 09/18/16 RWW    Proteus species NOT DETECTED NOT DETECTED Final   Serratia marcescens NOT DETECTED NOT DETECTED Final   Carbapenem resistance NOT DETECTED NOT DETECTED Final   Haemophilus influenzae NOT DETECTED  NOT DETECTED Final   Neisseria meningitidis NOT DETECTED NOT DETECTED Final   Pseudomonas aeruginosa NOT DETECTED NOT DETECTED Final   Candida albicans NOT DETECTED NOT DETECTED Final   Candida glabrata NOT DETECTED NOT DETECTED Final   Candida krusei NOT DETECTED NOT DETECTED Final   Candida parapsilosis NOT DETECTED NOT DETECTED Final   Candida tropicalis NOT DETECTED NOT DETECTED Final  MRSA PCR Screening     Status: None   Collection Time: 09/17/16  7:35 PM  Result Value Ref Range Status   MRSA by PCR NEGATIVE NEGATIVE Final    Comment:        The GeneXpert MRSA Assay (FDA approved for NASAL specimens only), is one component of a comprehensive MRSA colonization surveillance program. It is not intended to diagnose MRSA infection nor to guide or monitor treatment for MRSA infections.   Aerobic/Anaerobic Culture (surgical/deep wound)     Status: None   Collection Time: 09/20/16 10:15 AM  Result Value Ref Range Status   Specimen Description BILE  Final   Special Requests CHOLECYSTOSTOMY  Final   Gram Stain   Final    RARE WBC PRESENT, PREDOMINANTLY PMN FEW GRAM NEGATIVE RODS    Culture   Final    ABUNDANT KLEBSIELLA PNEUMONIAE NO ANAEROBES ISOLATED Performed at [facility]    Report Status 09/25/2016 FINAL  Final   Organism ID, Bacteria KLEBSIELLA PNEUMONIAE  Final      Susceptibility   Klebsiella pneumoniae - MIC*    AMPICILLIN >=32 RESISTANT Resistant     CEFAZOLIN <=4 SENSITIVE Sensitive     CEFEPIME <=1  SENSITIVE Sensitive     CEFTAZIDIME <=1 SENSITIVE Sensitive     CEFTRIAXONE <=1 SENSITIVE Sensitive     CIPROFLOXACIN <=0.25 SENSITIVE Sensitive     GENTAMICIN <=1 SENSITIVE Sensitive     IMIPENEM 0.5 SENSITIVE Sensitive     TRIMETH/SULFA <=20 SENSITIVE Sensitive     AMPICILLIN/SULBACTAM 4 SENSITIVE Sensitive     PIP/TAZO <=4 SENSITIVE Sensitive     Extended ESBL NEGATIVE Sensitive     * ABUNDANT KLEBSIELLA PNEUMONIAE  Culture, blood (Routine X 2) w Reflex to ID Panel     Status: None (Preliminary result)   Collection Time: 09/22/16  1:16 PM  Result Value Ref Range Status   Specimen Description BLOOD RIGHT ANTECUBITAL  Final   Special Requests BOTTLES DRAWN AEROBIC AND ANAEROBIC   Final   Culture NO GROWTH 3 DAYS  Final   Report Status PENDING  Incomplete  Culture, blood (Routine X 2) w Reflex to ID Panel     Status: None (Preliminary result)   Collection Time: 09/22/16  1:34 PM  Result Value Ref Range Status   Specimen Description BLOOD  LEFT HAND  Final   Special Requests BOTTLES DRAWN AEROBIC AND ANAEROBIC  5 ML  Final   Culture NO GROWTH 3 DAYS  Final   Report Status PENDING  Incomplete      Scheduled Meds: . ciprofloxacin  400 mg Intravenous Q12H  . enoxaparin (LOVENOX) injection  40 mg Subcutaneous Q24H  . famotidine  20 mg Oral BID  . metroNIDAZOLE  500 mg Oral Q8H  . potassium chloride  40 mEq Oral Once    Assessment/Plan:  1. Klebsiella sepsis present on admission. Gallbladder and pneumonia source. Cipro would cover. 2. Acute cholecystitis with sepsis. Patient placed on Cipro and Flagyl. Interventional radiology put a drainage tube in his gallbladder. Surgery will likely take out his gallbladder when sepsis is  cleared. Case discussed with Dr. Excell Seltzer surgery.  Case discussed with mother at the bedside and nursing staff will have to teach the mother had her drain this tube. 3. Hypokalemia. Replace potassium orally today. Check potassium and magnesium  tomorrow. 4. History of autism  Code Status:     Code Status Orders        Start     Ordered   09/18/16 1421  Full code  Continuous     09/18/16 1424    Code Status History    Date Active Date Inactive Code Status Order ID Comments User Context   09/17/2016  2:57 PM 09/18/2016  2:24 PM Full Code [id number]  Gladis Riffle, MD ED     Family Communication: Mother at the bedside Disposition Plan: Potentially home tomorrow if able to come off oxygen and afebrile  Consultants:  General surgery  Infectious disease  Antibiotics:  Cipro  Flagyl  Time spent: 28 minutes  Alford Highland  [employer]

## 2016-09-25 NOTE — Evaluation (Signed)
Physical Therapy Evaluation Patient Details Name: Joshua [name] MRN: [id number] DOB: [birth year] Today's Date: 09/25/2016   History of Present Illness  Pt. is 20 y.o. male presented to [facility] with acute cholecystitis, pt. was admitted with plan for surgical interention however experienced acute respiratory failure and was admitted to CCU found to be positive for pneumonia and severe sepsis. Pt. transferred out of CCU 10/15, currently has percutaneous cholecystostomy.   Clinical Impression  Pt. In chair upon arrival, oriented and agreeable to activity. Pt. Demonstrates grossly WFL AROM and strength throughout grossly 5/5 strength BUE and BLE. Pt. Demonstrates mod I sit<>stand transfers, able to ambulate approx. 134ft. CGA pt. Demonstrates B LE ER and slowed cadence, overall even step length step through pattern. Pt. Able to ascend/descend 6 stairs with R railing CGA demonstrating reciprocal pattern in both directions requiring additional time with descent.  Pt. And his mother subjectively state that he is at his baseline level of mobility and do not have further concerns about mobility. Pt. Does not demonstrate further need for skilled PT.    Follow Up Recommendations      Equipment Recommendations       Recommendations for Other Services       Precautions / Restrictions Precautions Precautions: Fall Restrictions Weight Bearing Restrictions: No      Mobility  Bed Mobility Overal bed mobility:  (pt. in chair upon arrival, returned to chair at end of session)                Transfers Overall transfer level: Modified independent               General transfer comment: Pt. demonstrates safe transfer technique without verbal cues  Ambulation/Gait Ambulation/Gait assistance: Min guard Ambulation Distance (Feet): 175 Feet         General Gait Details: pt. demonstrates decreased cadence, B ER LE's, overall even step length with step through pattern, no buckling or LOB.    Stairs Stairs: Yes Stairs assistance: Min guard Stair Management: One rail Right Number of Stairs: 6 General stair comments: Pt. able to ascend/descend stairs with reciprocal pattern in both directions, takes additional time with descent.   Wheelchair Mobility    Modified Rankin (Stroke Patients Only)       Balance Overall balance assessment: Modified Independent                                           Pertinent Vitals/Pain Pain Assessment: No/denies pain    Home Living Family/patient expects to be discharged to:: Private residence Living Arrangements: Parent (Mother and step father) Available Help at Discharge: Family;Available 24 hours/day (Mother will be present 24/7 upon d/c) Type of Home: House Home Access: Stairs to enter Entrance Stairs-Rails: Right Entrance Stairs-Number of Steps: 4 Home Layout: One level Home Equipment: None      Prior Function Level of Independence: Independent               Hand Dominance        Extremity/Trunk Assessment   Upper Extremity Assessment: Overall WFL for tasks assessed (BUE strength grossly 5/5, sensation intact)           Lower Extremity Assessment: Overall WFL for tasks assessed (B LE strength grossly 5/5 sensation intact)         Communication   Communication: No difficulties  Cognition Arousal/Alertness: Awake/alert Behavior During Therapy: [facility]  for tasks assessed/performed Overall Cognitive Status: Within Functional Limits for tasks assessed                      General Comments      Exercises     Assessment/Plan    PT Assessment Patent does not need any further PT services  PT Problem List            PT Treatment Interventions      PT Goals (Current goals can be found in the Care Plan section)  Acute Rehab PT Goals Patient Stated Goal: pt. wants to return home PT Goal Formulation: With patient Time For Goal Achievement: 10/09/16 Potential to Achieve  Goals: Good    Frequency     Barriers to discharge        Co-evaluation               End of Session Equipment Utilized During Treatment: Gait belt Activity Tolerance: Patient tolerated treatment well Patient left: in chair;with call bell/phone within reach;with family/visitor present (mother present) Nurse Communication: Mobility status;Other (comment) (vital signs)         Time: [id number] PT Time Calculation (min) (ACUTE ONLY): 16 min   Charges:         PT G Codes:         Huntley DecLeah Evelean Bigler, SPT  09/25/16,4:35 PM

## 2016-09-25 NOTE — Progress Notes (Signed)
[facility] CLINIC INFECTIOUS DISEASE PROGRESS NOTE Date of Admission:  09/17/2016     ID: Joshua [name] is a 20 y.o. male with cholecystitis Principal Problem:   Acute calculous cholecystitis Active Problems:   Acute cholecystitis   Pneumothorax   Subjective: Fevers resolved. Out of unit.   ROS  Eleven systems are reviewed and negative except per hpi  Medications:  Antibiotics Given (last 72 hours)    Date/Time Action Medication Dose Rate   09/22/16 2137 Given   metroNIDAZOLE (FLAGYL) tablet 500 mg 500 mg    09/23/16 0402 Given   ciprofloxacin (CIPRO) IVPB 400 mg 400 mg 200 mL/hr   09/23/16 0523 Given   metroNIDAZOLE (FLAGYL) tablet 500 mg 500 mg    09/23/16 1603 Given   ciprofloxacin (CIPRO) IVPB 400 mg 400 mg 200 mL/hr   09/23/16 1603 Given   metroNIDAZOLE (FLAGYL) tablet 500 mg 500 mg    09/23/16 2146 Given   metroNIDAZOLE (FLAGYL) tablet 500 mg 500 mg    09/24/16 0500 Given   ciprofloxacin (CIPRO) IVPB 400 mg 400 mg 200 mL/hr   09/24/16 0500 Given   metroNIDAZOLE (FLAGYL) tablet 500 mg 500 mg    09/24/16 1357 Given   metroNIDAZOLE (FLAGYL) tablet 500 mg 500 mg    09/24/16 1635 Given   ciprofloxacin (CIPRO) IVPB 400 mg 400 mg 200 mL/hr   09/24/16 2220 Given   metroNIDAZOLE (FLAGYL) tablet 500 mg 500 mg    09/25/16 0415 Given   ciprofloxacin (CIPRO) IVPB 400 mg 400 mg 200 mL/hr   09/25/16 0703 Given   metroNIDAZOLE (FLAGYL) tablet 500 mg 500 mg    09/25/16 1340 Given   metroNIDAZOLE (FLAGYL) tablet 500 mg 500 mg    09/25/16 1612 Given   ciprofloxacin (CIPRO) IVPB 400 mg 400 mg 200 mL/hr     . ciprofloxacin  500 mg Oral BID  . enoxaparin (LOVENOX) injection  40 mg Subcutaneous Q24H  . famotidine  20 mg Oral BID  . metroNIDAZOLE  500 mg Oral Q8H    Objective: Vital signs in last 24 hours: Temp:  [97.3 F (36.3 C)-99.5 F (37.5 C)] 97.3 F (36.3 C) (10/16 1108) Pulse Rate:  [87-92] 91 (10/16 1108) Resp:  [18-20] 18 (10/16 1108) BP: (118-130)/(60-73)  130/71 (10/16 1108) SpO2:  [92 %-99 %] 92 % (10/16 1502) Constitutional: He is oriented to person, place, and time. oob in chair, comfortable HENT: anicteric Mouth/Throat: Oropharynx is clear and moist. No oropharyngeal exudate.  Cardiovascular: Normal rate, regular rhythm and normal heart sounds.  Pulmonary/Chest: bil rhonhci Abdominal: Soft. Bowel sounds are normal. He exhibits no distension. There is mild tenderness. Perc chole tube with green thin drainage Lymphadenopathy: He has no cervical adenopathy.  Neurological: He is alert and oriented to person, place, and time.  Skin: Skin is warm and dry. No rash noted. No erythema.  Psychiatric: He has a normal mood and affect. His behavior is normal.  Lab Results  Recent Labs  09/23/16 0354 09/24/16 0500  WBC 6.7 7.3  HGB 14.3 15.2  HCT 41.8 44.1  NA 135 138  K 3.3* 3.2*  CL 94* 96*  CO2 32 34*  BUN 12 10  CREATININE 0.89 0.85    Microbiology: Results for orders placed or performed during the hospital encounter of 09/17/16  Blood Culture (routine x 2)     Status: Abnormal   Collection Time: 09/17/16  9:44 AM  Result Value Ref Range Status   Specimen Description BLOOD LEFT ANTECUBITAL  Final  Special Requests BOTTLES DRAWN AEROBIC AND ANAEROBIC  10CC  Final   Culture  Setup Time   Final    GRAM NEGATIVE RODS IN BOTH AEROBIC AND ANAEROBIC BOTTLES CRITICAL RESULT CALLED TO, READ BACK BY AND VERIFIED WITH: MATT MCBANE AT 0302 ON 09/18/16 RWW CONFIRMED BY PM[facility] Performed at [facility]    Culture KLEBSIELLA PNEUMONIAE (A)  Final   Report Status 09/20/2016 FINAL  Final   Organism ID, Bacteria KLEBSIELLA PNEUMONIAE  Final      Susceptibility   Klebsiella pneumoniae - MIC*    AMPICILLIN >=32 RESISTANT Resistant     CEFAZOLIN <=4 SENSITIVE Sensitive     CEFEPIME <=1 SENSITIVE Sensitive     CEFTAZIDIME <=1 SENSITIVE Sensitive     CEFTRIAXONE <=1 SENSITIVE Sensitive     CIPROFLOXACIN <=0.25 SENSITIVE Sensitive      GENTAMICIN <=1 SENSITIVE Sensitive     IMIPENEM <=0.25 SENSITIVE Sensitive     TRIMETH/SULFA <=20 SENSITIVE Sensitive     AMPICILLIN/SULBACTAM 4 SENSITIVE Sensitive     PIP/TAZO <=4 SENSITIVE Sensitive     Extended ESBL NEGATIVE Sensitive     * KLEBSIELLA PNEUMONIAE  Blood Culture (routine x 2)     Status: Abnormal   Collection Time: 09/17/16  9:44 AM  Result Value Ref Range Status   Specimen Description BLOOD LEFT FOREARM  Final   Special Requests BOTTLES DRAWN AEROBIC AND ANAEROBIC  10CC  Final   Culture  Setup Time   Final    GRAM NEGATIVE RODS ANAEROBIC BOTTLE ONLY CRITICAL VALUE NOTED.  VALUE IS CONSISTENT WITH PREVIOUSLY REPORTED AND CALLED VALUE. CONFIRMED BY PMH    Culture (A)  Final    KLEBSIELLA PNEUMONIAE SUSCEPTIBILITIES PERFORMED ON PREVIOUS CULTURE WITHIN THE LAST 5 DAYS. Performed at [facility]    Report Status 09/20/2016 FINAL  Final  Blood Culture ID Panel (Reflexed)     Status: Abnormal   Collection Time: 09/17/16  9:44 AM  Result Value Ref Range Status   Enterococcus species NOT DETECTED NOT DETECTED Final   Listeria monocytogenes NOT DETECTED NOT DETECTED Final   Staphylococcus species NOT DETECTED NOT DETECTED Final   Staphylococcus aureus NOT DETECTED NOT DETECTED Final   Streptococcus species NOT DETECTED NOT DETECTED Final   Streptococcus agalactiae NOT DETECTED NOT DETECTED Final   Streptococcus pneumoniae NOT DETECTED NOT DETECTED Final   Streptococcus pyogenes NOT DETECTED NOT DETECTED Final   Acinetobacter baumannii NOT DETECTED NOT DETECTED Final   Enterobacteriaceae species DETECTED (A) NOT DETECTED Final    Comment: CRITICAL RESULT CALLED TO, READ BACK BY AND VERIFIED WITH: MATT MCBANE AT 0302 ON 09/18/16 RWW    Enterobacter cloacae complex NOT DETECTED NOT DETECTED Final   Escherichia coli NOT DETECTED NOT DETECTED Final   Klebsiella oxytoca NOT DETECTED NOT DETECTED Final   Klebsiella pneumoniae DETECTED (A) NOT DETECTED Final     Comment: CRITICAL RESULT CALLED TO, READ BACK BY AND VERIFIED WITH: MATT MCBANE AT 0302 ON 09/18/16 RWW    Proteus species NOT DETECTED NOT DETECTED Final   Serratia marcescens NOT DETECTED NOT DETECTED Final   Carbapenem resistance NOT DETECTED NOT DETECTED Final   Haemophilus influenzae NOT DETECTED NOT DETECTED Final   Neisseria meningitidis NOT DETECTED NOT DETECTED Final   Pseudomonas aeruginosa NOT DETECTED NOT DETECTED Final   Candida albicans NOT DETECTED NOT DETECTED Final   Candida glabrata NOT DETECTED NOT DETECTED Final   Candida krusei NOT DETECTED NOT DETECTED Final   Candida parapsilosis NOT  DETECTED NOT DETECTED Final   Candida tropicalis NOT DETECTED NOT DETECTED Final  MRSA PCR Screening     Status: None   Collection Time: 09/17/16  7:35 PM  Result Value Ref Range Status   MRSA by PCR NEGATIVE NEGATIVE Final    Comment:        The GeneXpert MRSA Assay (FDA approved for NASAL specimens only), is one component of a comprehensive MRSA colonization surveillance program. It is not intended to diagnose MRSA infection nor to guide or monitor treatment for MRSA infections.   Aerobic/Anaerobic Culture (surgical/deep wound)     Status: None   Collection Time: 09/20/16 10:15 AM  Result Value Ref Range Status   Specimen Description BILE  Final   Special Requests CHOLECYSTOSTOMY  Final   Gram Stain   Final    RARE WBC PRESENT, PREDOMINANTLY PMN FEW GRAM NEGATIVE RODS    Culture   Final    ABUNDANT KLEBSIELLA PNEUMONIAE NO ANAEROBES ISOLATED Performed at [facility]    Report Status 09/25/2016 FINAL  Final   Organism ID, Bacteria KLEBSIELLA PNEUMONIAE  Final      Susceptibility   Klebsiella pneumoniae - MIC*    AMPICILLIN >=32 RESISTANT Resistant     CEFAZOLIN <=4 SENSITIVE Sensitive     CEFEPIME <=1 SENSITIVE Sensitive     CEFTAZIDIME <=1 SENSITIVE Sensitive     CEFTRIAXONE <=1 SENSITIVE Sensitive     CIPROFLOXACIN <=0.25 SENSITIVE Sensitive      GENTAMICIN <=1 SENSITIVE Sensitive     IMIPENEM 0.5 SENSITIVE Sensitive     TRIMETH/SULFA <=20 SENSITIVE Sensitive     AMPICILLIN/SULBACTAM 4 SENSITIVE Sensitive     PIP/TAZO <=4 SENSITIVE Sensitive     Extended ESBL NEGATIVE Sensitive     * ABUNDANT KLEBSIELLA PNEUMONIAE  Culture, blood (Routine X 2) w Reflex to ID Panel     Status: None (Preliminary result)   Collection Time: 09/22/16  1:16 PM  Result Value Ref Range Status   Specimen Description BLOOD RIGHT ANTECUBITAL  Final   Special Requests BOTTLES DRAWN AEROBIC AND ANAEROBIC   Final   Culture NO GROWTH 3 DAYS  Final   Report Status PENDING  Incomplete  Culture, blood (Routine X 2) w Reflex to ID Panel     Status: None (Preliminary result)   Collection Time: 09/22/16  1:34 PM  Result Value Ref Range Status   Specimen Description BLOOD  LEFT HAND  Final   Special Requests BOTTLES DRAWN AEROBIC AND ANAEROBIC  5 ML  Final   Culture NO GROWTH 3 DAYS  Final   Report Status PENDING  Incomplete    Studies/Results: Dg Chest 1 View  Result Date: 09/23/2016 CLINICAL DATA:  Pneumonia EXAM: CHEST 1 VIEW COMPARISON:  Multiple recent previous exams. FINDINGS: 0524 hours. Low lung volumes with stable asymmetric elevation right hemidiaphragm. The diffuse interstitial and airspace disease seen previously has improved in the interval with some patchy bilateral airspace disease persisting with slight central predominance. Cardiopericardial silhouette is at upper limits of normal for size. The visualized bony structures of the thorax are intact. Telemetry leads overlie the chest. IMPRESSION: Interval improvement in lung aeration, suggesting resolving edema. Electronically Signed   By: Kennith Center M.D.   On: 09/23/2016 08:35   Dg Chest 1 View  Result Date: 09/18/2016 CLINICAL DATA:  Shortness of breath and right upper abdominal pain. EXAM: CHEST 1 VIEW COMPARISON:  09/17/2016 FINDINGS: Lung volumes are lower compared to the prior study with  atelectasis  present bilaterally, especially in the right perihilar lung. No overt edema or pleural effusion. The heart size and mediastinal contours are normal. IMPRESSION: Lower lung volumes with more prominent atelectasis bilaterally, most prominently in the right perihilar region. Electronically Signed   By: Irish Lack M.D.   On: 09/18/2016 12:13   Dg Chest 2 View  Result Date: 09/17/2016 CLINICAL DATA:  Pt has a fever and is having RUQ pain for a couple of days now. Nonsmoker. No heart or lung problems. EXAM: CHEST  2 VIEW COMPARISON:  None. FINDINGS: The heart size and mediastinal contours are within normal limits. Both lungs are clear. No pleural effusion or pneumothorax. The visualized skeletal structures are unremarkable. IMPRESSION: No active cardiopulmonary disease. Electronically Signed   By: Amie Portland M.D.   On: 09/17/2016 11:53   Dg Abd 1 View  Result Date: 09/23/2016 CLINICAL DATA:  Acute cholecystitis EXAM: ABDOMEN - 1 VIEW COMPARISON:  None. FINDINGS: Percutaneous drainage catheter overlies the right abdomen. Prominent gastric bubble. Air is stool noted in the colon. IMPRESSION: No evidence of bowel obstruction. Electronically Signed   By: Kennith Center M.D.   On: 09/23/2016 08:37   Ct Chest W Contrast  Result Date: 09/23/2016 CLINICAL DATA:  Follow-up pneumonia.  Follow-up colostomy 2. EXAM: CT CHEST, ABDOMEN, AND PELVIS WITH CONTRAST TECHNIQUE: Multidetector CT imaging of the chest, abdomen and pelvis was performed following the standard protocol during bolus administration of intravenous contrast. CONTRAST:  ISOVUE-300 IOPAMIDOL (ISOVUE-300) INJECTION 61% COMPARISON:  CT of the chest September 18, 2016 and CT the abdomen/ pelvis September 17, 2016 FINDINGS: CT CHEST FINDINGS Cardiovascular: No significant vascular findings. Normal heart size. No pericardial effusion. Mediastinum/Nodes: No enlarged mediastinal, hilar, or axillary lymph nodes. Thyroid gland, trachea, and  esophagus demonstrate no significant findings. Lungs/Pleura: The central airways are normal. No pneumothorax. Bilateral patchy infiltrates remain. Overall, there has been significant interval improvement. Opacity posteriorly in the left base on series 4, image 100 is more focal in the interval. Musculoskeletal: No chest wall mass or suspicious bone lesions identified. CT ABDOMEN PELVIS FINDINGS Hepatobiliary: A cholecystostomy tube is seen in the gallbladder. Surrounding stranding identified. There is a fluid collection adjacent to the inferior right hepatic lobe on axial image 54 measuring 4.6 by 4.2 by 3.0 cm. This extends towards the inferior aspect of the gallbladder. No liver masses. The portal vein is normal. Pancreas: Unremarkable. No pancreatic ductal dilatation or surrounding inflammatory changes. Spleen: Normal in size without focal abnormality. Adrenals/Urinary Tract: Adrenal glands are unremarkable. Kidneys are normal, without renal calculi, focal lesion, or hydronephrosis. Bladder is unremarkable. Stomach/Bowel: Stomach is within normal limits. Appendix appears normal. No evidence of bowel wall thickening, distention, or inflammatory changes. Vascular/Lymphatic: No significant vascular findings are present. No enlarged abdominal or pelvic lymph nodes. Reproductive: Prostate is unremarkable. Other: No free air.  No other significant abnormalities. Musculoskeletal: No acute or significant osseous findings. IMPRESSION: 1. Bilateral patchy pulmonary infiltrates have overall improved in the interval but significant infiltrate remains. Focal infiltrate in the posterior inferior left lung base has actually become more focal in the interval. Recommend follow-up to complete resolution. 2. A cholecystostomy tube is identified. 3. There is a fluid collection adjacent to the undersurface of the right hepatic lobe which extends towards the inferior gallbladder. The findings are nonspecific but an abscess is not  excluded. Leakage from the gallbladder resulting in a biloma would demonstrate a similar appearance. These results will be called to the ordering clinician or representative by  the Radiologist Assistant, and communication documented in the PACS or zVision Dashboard. Electronically Signed   By: Gerome Sam III M.D   On: 09/23/2016 11:58   Ct Angio Chest Pe W Or Wo Contrast  Result Date: 09/18/2016 CLINICAL DATA:  Initial evaluation for acute dyspnea. EXAM: CT ANGIOGRAPHY CHEST WITH CONTRAST TECHNIQUE: Multidetector CT imaging of the chest was performed using the standard protocol during bolus administration of intravenous contrast. Multiplanar CT image reconstructions and MIPs were obtained to evaluate the vascular anatomy. CONTRAST:  75 cc of Isovue 370. COMPARISON:  Prior radiograph from earlier same day. FINDINGS: Cardiovascular: Intrathoracic aorta of normal caliber and appearance without acute abnormality. Visualized great vessels within normal limits. Heart size normal. No pericardial effusion. Pulmonary arteries adequately opacified for evaluation. Main pulmonary artery measures within normal limits for size at 2.2 cm in diameter. No filling defect to suggest acute pulmonary embolism. Re-formatted imaging confirms these findings. Mediastinum/Nodes: The visualized thyroid is normal. No pathologically enlarged mediastinal or hilar lymph nodes identified. No axillary adenopathy. Esophagus within normal limits. Lungs/Pleura: Patchy multi focal consolidation present within the posterior aspect of both upper and lower lobes, concerning for multifocal pneumonia. Changes most severe on the right. Findings may be infectious nature, although possible aspiration could be considered. Associated small right pleural effusion. No pulmonary edema. No pneumothorax. No worrisome pulmonary nodule or mass. Tracheobronchial tree remains patent. Upper Abdomen: Visualized portions of the upper abdomen are unremarkable.  Musculoskeletal: No acute osseous abnormality. No worrisome lytic or blastic osseous lesions. Review of the MIP images confirms the above findings. IMPRESSION: 1. No CT evidence for acute pulmonary embolism. 2. Multi focal areas of consolidation involving both the right upper and lower lobes, concerning for multifocal pneumonia. Changes are slightly worse on the right. Findings may be infectious in nature. Possible aspiration pneumonitis could also be considered. 3. Small right pleural effusion. Electronically Signed   By: Rise Mu M.D.   On: 09/18/2016 14:27   Nm Hepatobiliary Liver Func  Result Date: 09/19/2016 CLINICAL DATA:  Acute cholecystitis. Right upper quadrant pain for 3 days. EXAM: NUCLEAR MEDICINE HEPATOBILIARY IMAGING TECHNIQUE: Sequential images of the abdomen were obtained out to 60 minutes following intravenous administration of radiopharmaceutical. RADIOPHARMACEUTICALS:  4.938 mCi Tc-45m Choletec IV. After 2 hours, 3 mg of morphine was injected and an additional 2.152 mCi Tc-71m Choletec injected IV. COMPARISON:  Abdominal CT 07/20/2016 FINDINGS: Prompt uptake and biliary excretion of activity by the liver is seen. Gallbladder activity is not visualized over the course of 2 hours. There is biliary activity and activity passes into the small bowel. 3 mg morphine administered IV followed by additional Choletec injection. No gallbladder was visualized after an additional 30 minutes. IMPRESSION: No gallbladder visualization consistent with acute cholecystitis, even after the administration of IV morphine. Electronically Signed   By: Rubye Oaks M.D.   On: 09/19/2016 21:49   [place] Intraoperative  Result Date: 09/20/2016 CLINICAL DATA:  Ultrasound was provided for use by the ordering physician, and a technical charge was applied by the performing facility.  No radiologist interpretation/professional services rendered.   Ct Abdomen Pelvis W Contrast  Result Date:  09/23/2016 CLINICAL DATA:  Follow-up pneumonia.  Follow-up colostomy 2. EXAM: CT CHEST, ABDOMEN, AND PELVIS WITH CONTRAST TECHNIQUE: Multidetector CT imaging of the chest, abdomen and pelvis was performed following the standard protocol during bolus administration of intravenous contrast. CONTRAST:  ISOVUE-300 IOPAMIDOL (ISOVUE-300) INJECTION 61% COMPARISON:  CT of the chest September 18, 2016 and CT the  abdomen/ pelvis September 17, 2016 FINDINGS: CT CHEST FINDINGS Cardiovascular: No significant vascular findings. Normal heart size. No pericardial effusion. Mediastinum/Nodes: No enlarged mediastinal, hilar, or axillary lymph nodes. Thyroid gland, trachea, and esophagus demonstrate no significant findings. Lungs/Pleura: The central airways are normal. No pneumothorax. Bilateral patchy infiltrates remain. Overall, there has been significant interval improvement. Opacity posteriorly in the left base on series 4, image 100 is more focal in the interval. Musculoskeletal: No chest wall mass or suspicious bone lesions identified. CT ABDOMEN PELVIS FINDINGS Hepatobiliary: A cholecystostomy tube is seen in the gallbladder. Surrounding stranding identified. There is a fluid collection adjacent to the inferior right hepatic lobe on axial image 54 measuring 4.6 by 4.2 by 3.0 cm. This extends towards the inferior aspect of the gallbladder. No liver masses. The portal vein is normal. Pancreas: Unremarkable. No pancreatic ductal dilatation or surrounding inflammatory changes. Spleen: Normal in size without focal abnormality. Adrenals/Urinary Tract: Adrenal glands are unremarkable. Kidneys are normal, without renal calculi, focal lesion, or hydronephrosis. Bladder is unremarkable. Stomach/Bowel: Stomach is within normal limits. Appendix appears normal. No evidence of bowel wall thickening, distention, or inflammatory changes. Vascular/Lymphatic: No significant vascular findings are present. No enlarged abdominal or pelvic lymph  nodes. Reproductive: Prostate is unremarkable. Other: No free air.  No other significant abnormalities. Musculoskeletal: No acute or significant osseous findings. IMPRESSION: 1. Bilateral patchy pulmonary infiltrates have overall improved in the interval but significant infiltrate remains. Focal infiltrate in the posterior inferior left lung base has actually become more focal in the interval. Recommend follow-up to complete resolution. 2. A cholecystostomy tube is identified. 3. There is a fluid collection adjacent to the undersurface of the right hepatic lobe which extends towards the inferior gallbladder. The findings are nonspecific but an abscess is not excluded. Leakage from the gallbladder resulting in a biloma would demonstrate a similar appearance. These results will be called to the ordering clinician or representative by the Radiologist Assistant, and communication documented in the PACS or zVision Dashboard. Electronically Signed   By: Gerome Sam III M.D   On: 09/23/2016 11:58   Ct Abdomen Pelvis W Contrast  Result Date: 09/17/2016 CLINICAL DATA:  Pt presents to ED with reports of RUQ abdominal pain. Pt denies NVD. Pt reports he had a small bowel movement yesterday but thinks he might be constipated. Pt reports decreased appetite EXAM: CT ABDOMEN AND PELVIS WITH CONTRAST TECHNIQUE: Multidetector CT imaging of the abdomen and pelvis was performed using the standard protocol following bolus administration of intravenous contrast. CONTRAST:  ISOVUE-300 IOPAMIDOL (ISOVUE-300) INJECTION 61% COMPARISON:  Right upper quadrant ultrasound, 09/17/2016 FINDINGS: Lower chest: No acute abnormality. Hepatobiliary: Despite the unremarkable appearance of the gallbladder on the current ultrasound, on CT, the gallbladder is distended. Wall is prominent but not overtly thickened. There is pericholecystic inflammation. A stone projects in the gallbladder neck. The liver is unremarkable. There is no bile duct  dilation. Pancreas: Unremarkable. No pancreatic ductal dilatation or surrounding inflammatory changes. Spleen: Normal in size without focal abnormality. Adrenals/Urinary Tract: Adrenal glands are unremarkable. Kidneys are normal, without renal calculi, focal lesion, or hydronephrosis. Bladder is unremarkable. Stomach/Bowel: Stomach is within normal limits. Appendix appears normal. No evidence of bowel wall thickening, distention, or inflammatory changes. Vascular/Lymphatic: No significant vascular findings are present. No enlarged abdominal or pelvic lymph nodes. Reproductive: Prostate is unremarkable. Other: No abdominal wall hernia or abnormality. No abdominopelvic ascites. Musculoskeletal: Unremarkable. IMPRESSION: 1. Gallbladder is distended with pericholecystic inflammation as well as a gallstone that projects in  the gallbladder neck. Despite the normal appearance of the gallbladder on the current ultrasound, the findings strongly suggest acute cholecystitis. No bile duct stone or biliary dilation. 2. No other acute abnormality within the abdomen pelvis. Exam is otherwise unremarkable. Electronically Signed   By: Amie Portlandavid  Ormond M.D.   On: 09/17/2016 11:59   Ir Perc Cholecystostomy  Result Date: 09/20/2016 INDICATION: Patient admitted with acute cholecystitis complicated by development of multi lobar pneumonia. As such, patient deemed a poor operative candidate and request made for placement of a percutaneous cholecystostomy tube for infection source control. EXAM: ULTRASOUND AND FLUOROSCOPIC-GUIDED CHOLECYSTOSTOMY TUBE PLACEMENT COMPARISON:  CT scan of the abdomen and pelvis - 09/17/2016; nuclear medicine HIDA scan - 09/19/2016; chest CT - 09/18/2026 MEDICATIONS: The patient is currently admitted to the hospital and on intravenous antibiotics. Antibiotics were administered within an appropriate time frame prior to skin puncture. ANESTHESIA/SEDATION: Moderate (conscious) sedation was employed during this  procedure. A total of Versed 2.5 mg and Fentanyl 75 mcg was administered intravenously. Moderate Sedation Time: 20 minutes. The patient's level of consciousness and vital signs were monitored continuously by radiology nursing throughout the procedure under my direct supervision. CONTRAST:  10mL ISOVUE-300 IOPAMIDOL (ISOVUE-300) INJECTION 61% - administered into the gallbladder fossa. FLUOROSCOPY TIME:  36 seconds (10 mGy) COMPLICATIONS: None immediate. PROCEDURE: Informed written consent was obtained from the patient's mother after a discussion of the risks, benefits and alternatives to treatment. Questions regarding the procedure were encouraged and answered. A timeout was performed prior to the initiation of the procedure. The right upper abdominal quadrant was prepped and draped in the usual sterile fashion, and a sterile drape was applied covering the operative field. Maximum barrier sterile technique with sterile gowns and gloves were used for the procedure. A timeout was performed prior to the initiation of the procedure. Local anesthesia was provided with 1% lidocaine with epinephrine. Ultrasound scanning of the right upper quadrant demonstrates a mildly distended gallbladder with minimal amount of gallbladder wall thickening. Utilizing a transhepatic approach, a 22 gauge needle was advanced into the gallbladder under direct ultrasound guidance. An ultrasound image was saved for documentation purposes. Appropriate intraluminal puncture was confirmed with the efflux of bile and advancement of an 0.018 wire into the gallbladder lumen. The needle was exchanged for an Accustick set. A small amount of contrast was injected to confirm appropriate intraluminal positioning. Over a Benson wire, a 10.2-French Cook cholecystomy tube was advanced into the gallbladder fossa, coiled and locked. Bile was aspirated and a small amount of contrast was injected as several post procedural spot fluoroscopic images were obtained  in various obliquities. A small amount of bile was aspirated, capped and sent to the laboratory for analysis. The catheter was secured to the skin with suture, connected to a drainage bag and a dressing was placed. The patient tolerated the procedure well without immediate post procedural complication. IMPRESSION: Successful ultrasound and fluoroscopic guided placement of a 10.2 French cholecystostomy tube. A small sample of aspirated bile was sent to the laboratory for analysis. Electronically Signed   By: Simonne ComeJohn  Watts M.D.   On: 09/20/2016 11:06   Dg Chest Port 1 View  Result Date: 09/20/2016 CLINICAL DATA:  Acute respiratory failure. EXAM: PORTABLE CHEST 1 VIEW COMPARISON:  Radiograph of September 19, 2016. FINDINGS: Stable cardiomediastinal silhouette. Hypoinflation of the lungs is noted. Increased bilateral perihilar and basilar interstitial densities are noted concerning for pulmonary edema. Minimal right pleural effusion is noted. Bony thorax is unremarkable. IMPRESSION: Increased bilateral perihilar and  basilar interstitial densities are noted concerning for pulmonary edema. Hypoinflation of the lungs is noted. Electronically Signed   By: Lupita Raider, M.D.   On: 09/20/2016 14:21   Dg Chest Port 1 View  Result Date: 09/19/2016 CLINICAL DATA:  Pneumothorax EXAM: PORTABLE CHEST 1 VIEW COMPARISON:  Yesterday FINDINGS: Lungs remain under aerated. Bilateral airspace disease is worse. Normal heart size. Upper mediastinum is prominent likely due to low volumes and vascular crowding. There is no pneumothorax. IMPRESSION: Worsening bilateral airspace disease. Electronically Signed   By: Jolaine Click M.D.   On: 09/19/2016 07:50   US Abdomen Limited Ruq  Result Date: 09/17/2016 CLINICAL DATA:  One day history of right upper quadrant pain EXAM: US ABDOMEN LIMITED - RIGHT UPPER QUADRANT COMPARISON:  None. FINDINGS: Gallbladder: No gallstones or wall thickening visualized. There is no pericholecystic fluid.  No sonographic Murphy sign noted by sonographer. Common bile duct: Diameter: 4 mm. No intrahepatic or extrahepatic biliary duct dilatation. Liver: No focal lesion identified. Within normal limits in parenchymal echogenicity. IMPRESSION: Study within normal limits. Electronically Signed   By: Bretta Bang III M.D.   On: 09/17/2016 10:36     Assessment/Plan: Joshua [name] is a 20 y.o. male with acute cholecystitis, Klebsiella bacteremia, moderate pneumonia with possible aspiration. He is s/p Perc chole done 10/11 and was on broad spectrum abx but had persistent fevers. WBC has decreased and he is clinically improving after change to cipro and flagyl   The Klebsiella is quite a sensitive organism however the infections are likely polymicrobial including anaerobes.   Recommendations Agree with change to oral cipro and cont oral flagyl If clinically stable agree with Dc on oral cipro and flagyl Will need surgery eventually. Would keep on abx until he has surgery if possible.   Thank you very much for the consult. Will follow with you.  Afnan Cadiente P   09/25/2016, 5:44 PM

## 2016-09-25 NOTE — Progress Notes (Signed)
Pt.'s mother was taught and demonstrated on how to flush the cholecystostomy tube and how to empty the drainage bag. Will continue to encourage mother on cholecystostomy tube care.   Karsten RoLauren E Hobbs

## 2016-09-25 NOTE — Progress Notes (Signed)
This was second encounter with this Pt whom the Ch had seen in ICU when the Ch was on-call. Although the Pt was making steady progress toward recovery, he looked dressed this afternoon. Reasons not known. Pt didn't remember the Ch, but the mother did. Chaplain spoke to the Pt spoke to Pt, who  responded only to questions the Ch asked by nodding his head. Pt was with his mother at the time of this visit and requested prayers for strength which was provided.   09/25/16 1600  Clinical Encounter Type  Visited With Patient and family together  Visit Type Follow-up  Referral From Other (Comment)  Consult/Referral To Chaplain  Spiritual Encounters  Spiritual Needs Prayer

## 2016-09-25 NOTE — Care Management (Signed)
Notified by mother that patient Medicaid card still has [facility] as primary care provider listed. Patient does not have an active PCP.  I called medicaid on behalf of the patient.  After a wait time of 45 minutes I was informed that the mother would have to go to the web site, review list of providers in her area, contact specific providers to see if they are accepting new patients, then contact the Medicaid office to notify them which provider the patient would like to see.  I have given all of this information to the patient's mother.  Mother initially requested home health services for assistance with t tube, and respiratory assessments due to active PNA.  I informed mother that patient would have to have a PCP prior to initiating of services.  After being instructed on how to care for the t tube, mother now states that she is comfortable caring for it in the home.

## 2016-09-25 NOTE — Care Management (Signed)
Patient has history of autism.  Lives at home with his mother.  Per surgery note "IV antibiotics today if we are able to wean him off his oxygen and changing to oral antibiotics could be performed in the next 12-24 hours and sending him home with a cholecystostomy tube for surgery at a later date"  If this is the discharge disposition bedside RN to instruct mother of care for T tube at home. RNCM following discharge needs.  Patient still requiring acute O2 at 1.5 liters

## 2016-09-26 LAB — CBC WITH DIFFERENTIAL/PLATELET
BASOS ABS: 0 10*3/uL (ref 0–0.1)
BASOS PCT: 0 %
EOS ABS: 0.1 10*3/uL (ref 0–0.7)
Eosinophils Relative: 1 %
HEMATOCRIT: 43.1 % (ref 40.0–52.0)
HEMOGLOBIN: 14.8 g/dL (ref 13.0–18.0)
Lymphocytes Relative: 19 %
Lymphs Abs: 1.9 10*3/uL (ref 1.0–3.6)
MCH: 29.9 pg (ref 26.0–34.0)
MCHC: 34.2 g/dL (ref 32.0–36.0)
MCV: 87.5 fL (ref 80.0–100.0)
MONOS PCT: 10 %
Monocytes Absolute: 0.9 10*3/uL (ref 0.2–1.0)
NEUTROS ABS: 6.9 10*3/uL — AB (ref 1.4–6.5)
NEUTROS PCT: 70 %
Platelets: 399 10*3/uL (ref 150–440)
RBC: 4.93 MIL/uL (ref 4.40–5.90)
RDW: 13.2 % (ref 11.5–14.5)
WBC: 9.8 10*3/uL (ref 3.8–10.6)

## 2016-09-26 LAB — COMPREHENSIVE METABOLIC PANEL
ALBUMIN: 2.8 g/dL — AB (ref 3.5–5.0)
ALK PHOS: 49 U/L (ref 38–126)
ALT: 60 U/L (ref 17–63)
ANION GAP: 8 (ref 5–15)
AST: 53 U/L — AB (ref 15–41)
BILIRUBIN TOTAL: 0.7 mg/dL (ref 0.3–1.2)
BUN: 12 mg/dL (ref 6–20)
CO2: 25 mmol/L (ref 22–32)
Calcium: 8.6 mg/dL — ABNORMAL LOW (ref 8.9–10.3)
Chloride: 103 mmol/L (ref 101–111)
Creatinine, Ser: 0.85 mg/dL (ref 0.61–1.24)
GFR calc Af Amer: >60 mL/min (ref >60)
GFR calc non Af Amer: >60 mL/min (ref >60)
GLUCOSE: 88 mg/dL (ref 65–99)
POTASSIUM: 4.6 mmol/L (ref 3.5–5.1)
SODIUM: 136 mmol/L (ref 135–145)
Total Protein: 6.4 g/dL — ABNORMAL LOW (ref 6.5–8.1)

## 2016-09-26 LAB — MAGNESIUM: Magnesium: 2.3 mg/dL (ref 1.7–2.4)

## 2016-09-26 MED ORDER — CIPROFLOXACIN HCL 500 MG PO TABS: 500.0000 mg | ORAL_TABLET | Freq: Two times a day (BID) | ORAL | 1 refills | Status: AC

## 2016-09-26 MED ORDER — OXYCODONE HCL 5 MG PO TABS: 5.0000 mg | ORAL_TABLET | ORAL | 0 refills | Status: DC | PRN

## 2016-09-26 NOTE — Progress Notes (Signed)
Patient discharge teaching given, including activity, diet, follow-up appoints, and medications. Patient verbalized understanding of all discharge instructions. Pt.'s mother demonstrated proper technique on how to flush the cholecystostomy tube and change dressing. Central Monitoring was D/C.Vitals are stable. Skin is intact except as charted in most recent assessments. Pt to be escorted out by volunteer, to be driven home by family.  Karsten RoLauren E Hobbs

## 2016-09-26 NOTE — Progress Notes (Signed)
CC: Acute cholecystitis Subjective: This a patient with acute cholecystitis for which a cholecystostomy tube has been placed. He was also septic with a gram-negative rod. He is now afebrile and feels well he has no complaints at this point and is tolerating a regular diet no fevers or chills  Objective: Vital signs in last 24 hours: Temp:  [97.3 F (36.3 C)-98.8 F (37.1 C)] 97.7 F (36.5 C) (10/17 0448) Pulse Rate:  [86-92] 86 (10/17 0448) Resp:  [18-20] 20 (10/17 0448) BP: (117-130)/(58-73) 117/62 (10/17 0448) SpO2:  [92 %-98 %] 93 % (10/17 0448) Weight:  [208 lb (94.3 kg)] 208 lb (94.3 kg) (10/16 1500) Last BM Date: 09/25/16  Intake/Output from previous day: 10/16 0701 - 10/17 0700 In: 740 [P.O.:720] Out: 531 [Urine:251; Drains:280] Intake/Output this shift: Total I/O In: 5 [Other:5] Out: 60 [Drains:60]  Physical exam:  Vital signs are stable afebrile no icterus no jaundice Soft nontender abd Cholecystostomy tube draining clear bile NT calves  Lab Results: CBC   Recent Labs  09/24/16 0500 09/26/16 0412  WBC 7.3 9.8  HGB 15.2 14.8  HCT 44.1 43.1  PLT 253 399   BMET  Recent Labs  09/24/16 0500 09/26/16 0412  NA 138 136  K 3.2* 4.6  CL 96* 103  CO2 34* 25  GLUCOSE 101* 88  BUN 10 12  CREATININE 0.85 0.85  CALCIUM 8.5* 8.6*   PT/INR No results for input(s): LABPROT, INR in the last 72 hours. ABG No results for input(s): PHART, HCO3 in the last 72 hours.  Invalid input(s): PCO2, PO2  Studies/Results: No results found.  Anti-infectives: Anti-infectives    Start     Dose/Rate Route Frequency Ordered Stop   09/25/16 2000  ciprofloxacin (CIPRO) tablet 500 mg     500 mg Oral 2 times daily 09/25/16 1634     09/22/16 1800  Ampicillin-Sulbactam (UNASYN) 3 g in sodium chloride 0.9 % 100 mL IVPB  Status:  Discontinued     3 g 100 mL/hr over 60 Minutes Intravenous Every 6 hours 09/22/16 1437 09/22/16 1439   09/22/16 1600  ciprofloxacin (CIPRO) IVPB 400  mg  Status:  Discontinued     400 mg 200 mL/hr over 60 Minutes Intravenous Every 12 hours 09/22/16 1439 09/25/16 1634   09/22/16 1500  metroNIDAZOLE (FLAGYL) tablet 500 mg  Status:  Discontinued     500 mg Oral Every 8 hours 09/22/16 1439 09/25/16 1829   09/22/16 1200  meropenem (MERREM) 1 g in sodium chloride 0.9 % 100 mL IVPB  Status:  Discontinued     1 g 200 mL/hr over 30 Minutes Intravenous Every 8 hours 09/22/16 1150 09/22/16 1420   09/21/16 1800  Ampicillin-Sulbactam (UNASYN) 3 g in sodium chloride 0.9 % 100 mL IVPB  Status:  Discontinued     3 g 100 mL/hr over 60 Minutes Intravenous Every 6 hours 09/21/16 1456 09/22/16 1150   09/19/16 1000  azithromycin (ZITHROMAX) tablet 250 mg     250 mg Oral Daily 09/18/16 1424 09/22/16 0920   09/18/16 2200  piperacillin-tazobactam (ZOSYN) IVPB 3.375 g  Status:  Discontinued     3.375 g 12.5 mL/hr over 240 Minutes Intravenous Every 8 hours 09/18/16 1303 09/18/16 1317   09/18/16 1430  azithromycin (ZITHROMAX) tablet 500 mg     500 mg Oral Daily 09/18/16 1424 09/18/16 1537   09/18/16 1400  meropenem (MERREM) 1 g in sodium chloride 0.9 % 100 mL IVPB  Status:  Discontinued     1  g 200 mL/hr over 30 Minutes Intravenous Every 8 hours 09/18/16 1319 09/21/16 1456   09/17/16 1800  piperacillin-tazobactam (ZOSYN) IVPB 3.375 g  Status:  Discontinued     3.375 g 12.5 mL/hr over 240 Minutes Intravenous Every 6 hours 09/17/16 1457 09/18/16 1303   09/17/16 0945  piperacillin-tazobactam (ZOSYN) IVPB 3.375 g     3.375 g 12.5 mL/hr over 240 Minutes Intravenous  Once 09/17/16 0939 09/17/16 1043      Assessment/Plan:  Labs reviewed.  Patient doing very well recommend discharge today on oral Cipro only with instructions concerning care for his cholecystostomy tube. He should be seen towards the end of the week by Dr. Tonita CongWoodham and scheduled for cholecystectomy at some point in the near future.  Lattie Hawichard E Joscelynn Brutus, MD, FACS  09/26/2016

## 2016-09-26 NOTE — Discharge Summary (Signed)
Physician Discharge Summary  Patient ID: Joshua [name] MRN: [id number] DOB/AGE: 1996/03/29 20 y.o.  Admit date: 09/17/2016 Discharge date: 09/26/2016   Discharge Diagnoses:  Principal Problem:   Acute calculous cholecystitis Active Problems:   Acute cholecystitis   Pneumothorax   Procedures:Cholecystostomy tube percutaneous by interventional radiology  Hospital Course: This patient admitted the hospital clear signs of acute cholecystitis and sepsis who ultimately grew Klebsiella from his blood. He was treated with IV antibiotics and due to his unstable condition he was treated with a cholecystostomy tube rather then general anesthetic and a cholecystectomy. Once the cholecystostomy tube was placed he defervesced rapidly and his diet was advanced he is currently tolerating a regular diet. He has a cholecystostomy tube in place which is draining clear bile without purulence. He has been afebrile with improving labs over the last few days. He is transition to oral Cipro alone as the Klebsiella was sensitive to Cipro. He will be discharged on oral Cipro and oral analgesics to follow up in our office on Friday 4 days from now for consideration and timing of laparoscopic cholecystectomy. This was discussed and reviewed with the patient and his mother as well as cholecystostomy tube care.  Consults: Internal medicine and infectious disease  Disposition: Final discharge disposition not confirmed  Discharge Instructions    Nursing communication    Complete by:  As directed    Please teach patient and family routine care for cholecystostomy tube       Medication List    TAKE these medications   ciprofloxacin 500 MG tablet Commonly known as:  CIPRO Take 1 tablet (500 mg total) by mouth 2 (two) times daily.   oxyCODONE 5 MG immediate release tablet Commonly known as:  Oxy IR/ROXICODONE Take 1-2 tablets (5-10 mg total) by mouth every 3 (three) hours as needed for moderate pain or severe  pain.      Follow-up Information    Ricarda Frameharles Woodham, MD. Go on 09/29/2016.   Specialty:  General Surgery Why:  @10 :30am Contact information: [address] [place] [address] [phone]           Lattie Hawichard E Graylee Arutyunyan, MD, FACS

## 2016-09-26 NOTE — Discharge Instructions (Signed)
Routine drain tube care May shower Regular diet Medications as prescribed Follow-up with Dr. Tonita CongWoodham this Friday

## 2016-09-26 NOTE — Care Management Note (Signed)
Case Management Note  Patient Details  Name: Joshua [name] MRN: [id number] Date of Birth: 1996/01/18  Subjective/Objective:     Mom reports again today that she feels comfortable and has demonstrated Tube care with Joshua [name]'s nurse. Mom again verbalized understanding of the importance of Joshua BattenCody obtaining a Medicaid PCP. No other needs. Discharge to home today.             Action/Plan:   Expected Discharge Date:                  Expected Discharge Plan:     In-House Referral:     Discharge planning Services     Post Acute Care Choice:    Choice offered to:     DME Arranged:    DME Agency:     HH Arranged:    HH Agency:     Status of Service:     If discussed at [employer] Length of Stay Meetings, dates discussed:    Additional Comments:  Freida Nebel A, RN 09/26/2016, 10:00 AM

## 2016-09-26 NOTE — Progress Notes (Signed)
Patient ID: Joshua [name], male   DOB: 07-Sep-1996, 20 y.o.   MRN: [id number]  [employer] PROGRESS NOTE  Joshua [name]:[id number] DOB: 10/31/1996 DOA: 09/17/2016 PCP: No PCP Per Patient  HPI/Subjective: Patient feels okay and offers no complaints. Tolerating diet. No fever.  Objective: Vitals:   09/25/16 2047 09/26/16 0448  BP: (!) 120/58 117/62  Pulse: 89 86  Resp: 20 20  Temp: 98.5 F (36.9 C) 97.7 F (36.5 C)    Filed Weights   09/23/16 0358 09/24/16 0400 09/25/16 1500  Weight: 96.3 kg (212 lb 4.9 oz) 91.6 kg (201 lb 15.1 oz) 94.3 kg (208 lb)    ROS: Review of Systems  Constitutional: Negative for chills and fever.  Eyes: Negative for blurred vision.  Respiratory: Negative for cough and shortness of breath.   Cardiovascular: Negative for chest pain.  Gastrointestinal: Negative for abdominal pain, constipation, diarrhea, nausea and vomiting.  Genitourinary: Negative for dysuria.  Musculoskeletal: Negative for joint pain.  Neurological: Negative for dizziness and headaches.   Exam: Physical Exam  Constitutional: He is oriented to person, place, and time.  HENT:  Nose: No mucosal edema.  Mouth/Throat: No oropharyngeal exudate or posterior oropharyngeal edema.  Eyes: Conjunctivae, EOM and lids are normal. Pupils are equal, round, and reactive to light.  Neck: No JVD present. Carotid bruit is not present. No edema present. No thyroid mass and no thyromegaly present.  Cardiovascular: S1 normal and S2 normal.  Exam reveals no gallop.   No murmur heard. Pulses:      Dorsalis pedis pulses are 2+ on the right side, and 2+ on the left side.  Respiratory: No respiratory distress. He has no wheezes. He has no rhonchi. He has no rales.  GI: Soft. Bowel sounds are normal. There is no tenderness.  Musculoskeletal:       Right ankle: He exhibits no swelling.       Left ankle: He exhibits no swelling.  Lymphadenopathy:    He has no cervical adenopathy.  Neurological: He  is alert and oriented to person, place, and time. No cranial nerve deficit.  Skin: Skin is warm. No rash noted. Nails show no clubbing.  Psychiatric: He has a normal mood and affect.      Data Reviewed: Basic Metabolic Panel:  Recent Labs Lab 09/21/16 0545 09/22/16 0522 09/23/16 0354 09/24/16 0500 09/26/16 0412  NA 133* 134* 135 138 136  K 3.5 3.7 3.3* 3.2* 4.6  CL 96* 93* 94* 96* 103  CO2 26 32 32 34* 25  GLUCOSE 97 103* 122* 101* 88  BUN 11 11 12 10 12   CREATININE 0.89 1.16 0.89 0.85 0.85  CALCIUM 8.2* 8.2* 8.3* 8.5* 8.6*  MG  --   --  2.0 2.4 2.3  PHOS  --   --  1.5* 4.1  --    Liver Function Tests:  Recent Labs Lab 09/20/16 1421 09/26/16 0412  AST 35 53*  ALT 31 60  ALKPHOS 62 49  BILITOT 1.3* 0.7  PROT 6.2* 6.4*  ALBUMIN 3.0* 2.8*   CBC:  Recent Labs Lab 09/20/16 1421 09/21/16 0545 09/22/16 0522 09/23/16 0354 09/24/16 0500 09/26/16 0412  WBC 7.9 5.9 6.3 6.7 7.3 9.8  NEUTROABS 6.8* 4.5  --   --   --  6.9*  HGB 14.8 14.6 14.8 14.3 15.2 14.8  HCT 41.7 41.2 42.3 41.8 44.1 43.1  MCV 85.3 85.1 86.6 86.6 87.4 87.5  PLT 193 185 176 195 253 399   BNP (  last 3 results)  Recent Labs  09/18/16 0601  BNP 75.0      Recent Results (from the past 240 hour(s))  Blood Culture (routine x 2)     Status: Abnormal   Collection Time: 09/17/16  9:44 AM  Result Value Ref Range Status   Specimen Description BLOOD LEFT ANTECUBITAL  Final   Special Requests BOTTLES DRAWN AEROBIC AND ANAEROBIC  10CC  Final   Culture  Setup Time   Final    GRAM NEGATIVE RODS IN BOTH AEROBIC AND ANAEROBIC BOTTLES CRITICAL RESULT CALLED TO, READ BACK BY AND VERIFIED WITH: MATT MCBANE AT 0302 ON 09/18/16 RWW CONFIRMED BY [facility] Performed at [facility]    Culture KLEBSIELLA PNEUMONIAE (A)  Final   Report Status 09/20/2016 FINAL  Final   Organism ID, Bacteria KLEBSIELLA PNEUMONIAE  Final      Susceptibility   Klebsiella pneumoniae - MIC*    AMPICILLIN >=32 RESISTANT  Resistant     CEFAZOLIN <=4 SENSITIVE Sensitive     CEFEPIME <=1 SENSITIVE Sensitive     CEFTAZIDIME <=1 SENSITIVE Sensitive     CEFTRIAXONE <=1 SENSITIVE Sensitive     CIPROFLOXACIN <=0.25 SENSITIVE Sensitive     GENTAMICIN <=1 SENSITIVE Sensitive     IMIPENEM <=0.25 SENSITIVE Sensitive     TRIMETH/SULFA <=20 SENSITIVE Sensitive     AMPICILLIN/SULBACTAM 4 SENSITIVE Sensitive     PIP/TAZO <=4 SENSITIVE Sensitive     Extended ESBL NEGATIVE Sensitive     * KLEBSIELLA PNEUMONIAE  Blood Culture (routine x 2)     Status: Abnormal   Collection Time: 09/17/16  9:44 AM  Result Value Ref Range Status   Specimen Description BLOOD LEFT FOREARM  Final   Special Requests BOTTLES DRAWN AEROBIC AND ANAEROBIC  10CC  Final   Culture  Setup Time   Final    GRAM NEGATIVE RODS ANAEROBIC BOTTLE ONLY CRITICAL VALUE NOTED.  VALUE IS CONSISTENT WITH PREVIOUSLY REPORTED AND CALLED VALUE. CONFIRMED BY PMH    Culture (A)  Final    KLEBSIELLA PNEUMONIAE SUSCEPTIBILITIES PERFORMED ON PREVIOUS CULTURE WITHIN THE LAST 5 DAYS. Performed at [facility]    Report Status 09/20/2016 FINAL  Final  Blood Culture ID Panel (Reflexed)     Status: Abnormal   Collection Time: 09/17/16  9:44 AM  Result Value Ref Range Status   Enterococcus species NOT DETECTED NOT DETECTED Final   Listeria monocytogenes NOT DETECTED NOT DETECTED Final   Staphylococcus species NOT DETECTED NOT DETECTED Final   Staphylococcus aureus NOT DETECTED NOT DETECTED Final   Streptococcus species NOT DETECTED NOT DETECTED Final   Streptococcus agalactiae NOT DETECTED NOT DETECTED Final   Streptococcus pneumoniae NOT DETECTED NOT DETECTED Final   Streptococcus pyogenes NOT DETECTED NOT DETECTED Final   Acinetobacter baumannii NOT DETECTED NOT DETECTED Final   Enterobacteriaceae species DETECTED (A) NOT DETECTED Final    Comment: CRITICAL RESULT CALLED TO, READ BACK BY AND VERIFIED WITH: MATT MCBANE AT 0302 ON 09/18/16 RWW     Enterobacter cloacae complex NOT DETECTED NOT DETECTED Final   Escherichia coli NOT DETECTED NOT DETECTED Final   Klebsiella oxytoca NOT DETECTED NOT DETECTED Final   Klebsiella pneumoniae DETECTED (A) NOT DETECTED Final    Comment: CRITICAL RESULT CALLED TO, READ BACK BY AND VERIFIED WITH: MATT MCBANE AT 0302 ON 09/18/16 RWW    Proteus species NOT DETECTED NOT DETECTED Final   Serratia marcescens NOT DETECTED NOT DETECTED Final   Carbapenem resistance NOT DETECTED NOT  DETECTED Final   Haemophilus influenzae NOT DETECTED NOT DETECTED Final   Neisseria meningitidis NOT DETECTED NOT DETECTED Final   Pseudomonas aeruginosa NOT DETECTED NOT DETECTED Final   Candida albicans NOT DETECTED NOT DETECTED Final   Candida glabrata NOT DETECTED NOT DETECTED Final   Candida krusei NOT DETECTED NOT DETECTED Final   Candida parapsilosis NOT DETECTED NOT DETECTED Final   Candida tropicalis NOT DETECTED NOT DETECTED Final  MRSA PCR Screening     Status: None   Collection Time: 09/17/16  7:35 PM  Result Value Ref Range Status   MRSA by PCR NEGATIVE NEGATIVE Final    Comment:        The GeneXpert MRSA Assay (FDA approved for NASAL specimens only), is one component of a comprehensive MRSA colonization surveillance program. It is not intended to diagnose MRSA infection nor to guide or monitor treatment for MRSA infections.   Aerobic/Anaerobic Culture (surgical/deep wound)     Status: None   Collection Time: 09/20/16 10:15 AM  Result Value Ref Range Status   Specimen Description BILE  Final   Special Requests CHOLECYSTOSTOMY  Final   Gram Stain   Final    RARE WBC PRESENT, PREDOMINANTLY PMN FEW GRAM NEGATIVE RODS    Culture   Final    ABUNDANT KLEBSIELLA PNEUMONIAE NO ANAEROBES ISOLATED Performed at [facility]    Report Status 09/25/2016 FINAL  Final   Organism ID, Bacteria KLEBSIELLA PNEUMONIAE  Final      Susceptibility   Klebsiella pneumoniae - MIC*    AMPICILLIN >=32  RESISTANT Resistant     CEFAZOLIN <=4 SENSITIVE Sensitive     CEFEPIME <=1 SENSITIVE Sensitive     CEFTAZIDIME <=1 SENSITIVE Sensitive     CEFTRIAXONE <=1 SENSITIVE Sensitive     CIPROFLOXACIN <=0.25 SENSITIVE Sensitive     GENTAMICIN <=1 SENSITIVE Sensitive     IMIPENEM 0.5 SENSITIVE Sensitive     TRIMETH/SULFA <=20 SENSITIVE Sensitive     AMPICILLIN/SULBACTAM 4 SENSITIVE Sensitive     PIP/TAZO <=4 SENSITIVE Sensitive     Extended ESBL NEGATIVE Sensitive     * ABUNDANT KLEBSIELLA PNEUMONIAE  Culture, blood (Routine X 2) w Reflex to ID Panel     Status: None (Preliminary result)   Collection Time: 09/22/16  1:16 PM  Result Value Ref Range Status   Specimen Description BLOOD RIGHT ANTECUBITAL  Final   Special Requests BOTTLES DRAWN AEROBIC AND ANAEROBIC   Final   Culture NO GROWTH 4 DAYS  Final   Report Status PENDING  Incomplete  Culture, blood (Routine X 2) w Reflex to ID Panel     Status: None (Preliminary result)   Collection Time: 09/22/16  1:34 PM  Result Value Ref Range Status   Specimen Description BLOOD  LEFT HAND  Final   Special Requests BOTTLES DRAWN AEROBIC AND ANAEROBIC  5 ML  Final   Culture NO GROWTH 4 DAYS  Final   Report Status PENDING  Incomplete      Scheduled Meds: . ciprofloxacin  500 mg Oral BID  . enoxaparin (LOVENOX) injection  40 mg Subcutaneous Q24H  . famotidine  20 mg Oral BID  . sodium chloride irrigation  5 mL Per Tube Q8H    Assessment/Plan:  1. Klebsiella sepsis present on admission. Gallbladder and pneumonia source. On cipro and flagyl 2. Acute cholecystitis with sepsis. Patient placed on Cipro and Flagyl. Interventional radiology put a drainage tube in his gallbladder. Surgery will likely take out his gallbladder  when sepsis is cleared. Case discussed with Dr. Excell Seltzerooper surgery, he will discharge today.  He will follow up as outpatient to remove gallbladder.  Family taught how to drain tube. 3. Hypokalemia. Replaced 4. History of  autism  Code Status:     Code Status Orders        Start     Ordered   09/18/16 1421  Full code  Continuous     09/18/16 1424    Code Status History    Date Active Date Inactive Code Status Order ID Comments User Context   09/17/2016  2:57 PM 09/18/2016  2:24 PM Full Code [id number]  Gladis Riffleatherine L Loflin, MD ED     Family Communication: Mother at the bedside Disposition Plan: Home today, spoke with DR Excell Seltzerooper  Consultants:  General surgery  Infectious disease  Antibiotics:  Cipro  Flagyl  Time spent: 20 minutes  Alford HighlandWIETING, Aprille Sawhney  [employer]

## 2016-09-27 LAB — CULTURE, BLOOD (ROUTINE X 2)
CULTURE: NO GROWTH
Culture: NO GROWTH

## 2016-09-29 ENCOUNTER — Ambulatory Visit (INDEPENDENT_AMBULATORY_CARE_PROVIDER_SITE_OTHER): Payer: Medicaid Other | Admitting: General Surgery

## 2016-09-29 ENCOUNTER — Encounter: Payer: Self-pay | Admitting: General Surgery

## 2016-09-29 VITALS — BP 120/86 | HR 82 | Temp 98.2°F | Ht 60.0 in | Wt 196.0 lb

## 2016-09-29 DIAGNOSIS — K810 Acute cholecystitis: Secondary | ICD-10-CM | POA: Diagnosis not present

## 2016-09-29 DIAGNOSIS — J150 Pneumonia due to Klebsiella pneumoniae: Secondary | ICD-10-CM | POA: Diagnosis not present

## 2016-09-29 NOTE — Progress Notes (Signed)
Outpatient Surgical Follow Up  09/29/2016  Joshua [name] is an 20 y.o. male.   Chief Complaint  Patient presents with  . Hospitalization Follow-up    Pneumothorax, acute cholecystitis- cholecystostomy drain in place    HPI: 20 year old male returns to clinic for hospital follow-up. He was recently discharged from the hospital after a prolonged stay secondary to acute cholecystitis that was, located by Klebsiella bacteremia as well as a multifocal pneumonia. He still has his drain in place that is draining bile without difficulty. He has been breathing better but continues to be on antibiotics for his pneumonia. He denies any fevers, chills, nausea, vomiting, chest pain, short of breath, diarrhea, constipation. She is becoming annoyed by the drain but states other than that is not really hurting. He desires no when he have his gallbladder and this drain removed.  Past Medical History:  Diagnosis Date  . Autism   . Irritable bowel syndrome     Past Surgical History:  Procedure Laterality Date  . EYE SURGERY    . IR GENERIC HISTORICAL  09/20/2016   IR PERC CHOLECYSTOSTOMY 09/20/2016 Joshua ComeJohn Watts, MD [facility]INTER[facility] RAD    Family History  Problem Relation Age of Onset  . Healthy Mother   . Healthy Father     Social History:  reports that he has never smoked. He has never used smokeless tobacco. He reports that he does not drink alcohol or use drugs.  Allergies: No Known Allergies  Medications reviewed.    ROS A multipoint review of systems was completed. All pertinent positives and negatives are documented within the history of present illness and remainder are negative.   BP 120/86 (BP Location: Left Arm, Patient Position: Sitting)   Pulse 82   Temp 98.2 F (36.8 C) (Oral)   Ht 5' (1.524 m)   Wt 88.9 kg (196 lb)   BMI 38.28 kg/m   Physical Exam Gen.: No acute distress Neck: Supple and nontender Chest: Clear to auscultation bilaterally, equal wall motion bilaterally, no  accessory muscle usage Heart: Regular rate and rhythm Abdomen: Soft, nontender, nondistended. Cholecystostomy drain and placed in the right upper quadrant draining bile. No evidence of spreading erythema or infection at the insertion site. Extremities: Moves all extremities well.     No results found for this or any previous visit (from the past 48 hour(s)). No results found.  Assessment/Plan:  1. Pneumonia of right lung due to Klebsiella pneumoniae, unspecified part of lung [facility](HCC) Patient with recent multilobar pneumonia. Needs to have image evidence of clearing of the infection prior to being scheduled for surgery. - DG Chest 2 View; Future - DG Chest 2 View  2. Acute cholecystitis Patient with percutaneous cholecystostomy tube still in place. Discussed at length that this we removed the time of surgery for his gallbladder. Timing of when the surgery should be performed is difficult secondary to his recent pneumonia and hospital stay. He'll follow-up in clinic after repeat chest x-ray to discuss results and operative timing. Otherwise, the procedure was described in the patient's and his mother are anxious to have this completed and this process over with.     Joshua Frameharles Artha Chiasson, MD FACS General Surgeon  09/29/2016,12:17 PM

## 2016-09-29 NOTE — Patient Instructions (Addendum)
[facility] on Thursday or Friday of next week from 7:00-6:00 pm  Please call us if you have any questions or concerns

## 2016-10-05 ENCOUNTER — Ambulatory Visit
Admission: RE | Admit: 2016-10-05 | Discharge: 2016-10-05 | Disposition: A | Discharge status: Home / Self Care | Payer: Medicaid Other | Source: Ambulatory Visit | Attending: General Surgery | Admitting: General Surgery

## 2016-10-05 DIAGNOSIS — Z09 Encounter for follow-up examination after completed treatment for conditions other than malignant neoplasm: Secondary | ICD-10-CM | POA: Diagnosis not present

## 2016-10-05 DIAGNOSIS — Z8701 Personal history of pneumonia (recurrent): Secondary | ICD-10-CM | POA: Diagnosis not present

## 2016-10-05 DIAGNOSIS — J150 Pneumonia due to Klebsiella pneumoniae: Secondary | ICD-10-CM | POA: Diagnosis present

## 2016-10-10 ENCOUNTER — Encounter: Payer: Self-pay | Admitting: General Surgery

## 2016-10-10 ENCOUNTER — Ambulatory Visit (INDEPENDENT_AMBULATORY_CARE_PROVIDER_SITE_OTHER): Payer: Medicaid Other | Admitting: General Surgery

## 2016-10-10 VITALS — BP 133/77 | HR 84 | Temp 98.1°F | Wt 198.0 lb

## 2016-10-10 DIAGNOSIS — J150 Pneumonia due to Klebsiella pneumoniae: Secondary | ICD-10-CM

## 2016-10-10 DIAGNOSIS — Z434 Encounter for attention to other artificial openings of digestive tract: Secondary | ICD-10-CM | POA: Diagnosis not present

## 2016-10-10 NOTE — Patient Instructions (Signed)
Please look at your blue sheet if you have any questions or concerns about your surgery.

## 2016-10-10 NOTE — Progress Notes (Signed)
Outpatient Surgical Follow Up  10/10/2016  Joshua [name] is an 20 y.o. male.   Chief Complaint  Patient presents with  . Follow-up    Pneumonia    HPI: 20 year old male returns to clinic for follow-up from a colostomy tube placement secondary to Klebsiella pneumonia. Patient reports feeling much better. He denies any fevers, chills, nausea, vomiting, chest pain, shortness breath, diarrhea, constipation. He's been eating well, he has been breathing well, he isn't having normal levels of activity. His drain continues to have a bilious output and is not causing him much discomfort. Patient and his mother strongly desired to have his gallbladder removed as soon as safely possible so that the drain can be removed. Patient has had a repeat chest x-ray since his last visit and he continues to be on antibiotics. Per infectious disease it was recommended he stay on antibiotics until his gallbladder is removed.  Past Medical History:  Diagnosis Date  . Autism   . Irritable bowel syndrome     Past Surgical History:  Procedure Laterality Date  . EYE SURGERY    . IR GENERIC HISTORICAL  09/20/2016   IR PERC CHOLECYSTOSTOMY 09/20/2016 Simonne ComeJohn Watts, MD [facility]    Family History  Problem Relation Age of Onset  . Healthy Mother   . Healthy Father     Social History:  reports that he has never smoked. He has never used smokeless tobacco. He reports that he does not drink alcohol or use drugs.  Allergies: No Known Allergies  Medications reviewed.    ROS A multipoint review of systems was completed, all pertinent positives and negatives are documented within the history of present illness and remainder are negative.   BP 133/77   Pulse 84   Temp 98.1 F (36.7 C) (Oral)   Wt 89.8 kg (198 lb)   BMI 38.67 kg/m   Physical Exam Gen.: No acute distress Neck: Supple and nontender Lymph nodes: No evidence of cervical or clavicular lymphadenopathy Chest: Clear to auscultation with  equal wall motion. Heart: Regular rate and rhythm Abdomen: Soft, nontender, nondistended. Cholecystostomy tube in place into the right upper quadrant with a bilious output. There is no erythema or drainage present from around the tube. Extremities: Moves all extremities well    No results found for this or any previous visit (from the past 48 hour(s)). No results found.  Assessment/Plan:  1. Cholecystostomy care [facility](HCC) 20 year old male with a colostomy tube in place secondary to acute cholecystitis. Discussed with the patient and mother that it is time to plan his cholecystectomy.I discussed the procedure in detail.  The patient was given [employer] engineered[employer] material.  We discussed the risks and benefits of a laparoscopic cholecystectomy and possible cholangiogram including, but not limited to bleeding, infection, injury to surrounding structures such as the intestine or liver, bile leak, retained gallstones, need to convert to an open procedure, prolonged diarrhea, blood clots such as  DVT, common bile duct injury, anesthesia risks, and possible need for additional procedures.  The likelihood of improvement in symptoms and return to the patient's normal status is good. We discussed the typical post-operative recovery course. They voiced understanding and desired to proceed. We'll tentatively plan to proceed for November 14.   2. Pneumonia of right lung due to Klebsiella pneumoniae, unspecified part of lung (HCC) Patient required continued antibiotics secondary to Klebsiella pneumonia. X-rays currently clear. Due to his prolonged hospital stay due to this complication discussed that he will require and in person anesthesia visit  prior to his day of surgery. Plan to continue his antibiotics until after his gallbladder is removed.     Aerica Rincon, MD FACS General Surgeon  10/10/2016,10:24 AM  

## 2016-10-12 ENCOUNTER — Telehealth: Payer: Self-pay | Admitting: General Surgery

## 2016-10-12 NOTE — Telephone Encounter (Signed)
Pt advised of pre op date/time and sx date. Sx: 10/24/16 with Dr Devoria AlbeWoodham--Laparoscopic cholecystectomy with IOC.  Pre op: 10/16/16 @ 8:00am--Office.   Patient made aware to call [phone], between 1-3:00pm the day before surgery, to find out what time to arrive.

## 2016-10-16 ENCOUNTER — Inpatient Hospital Stay: Admission: RE | Admit: 2016-10-16 | Payer: Medicaid Other | Source: Ambulatory Visit

## 2016-10-19 ENCOUNTER — Encounter
Admission: RE | Admit: 2016-10-19 | Discharge: 2016-10-19 | Disposition: A | Discharge status: Home / Self Care | Payer: Medicaid Other | Source: Ambulatory Visit | Attending: General Surgery | Admitting: General Surgery

## 2016-10-19 DIAGNOSIS — K819 Cholecystitis, unspecified: Secondary | ICD-10-CM | POA: Insufficient documentation

## 2016-10-19 DIAGNOSIS — Z01818 Encounter for other preprocedural examination: Secondary | ICD-10-CM | POA: Diagnosis present

## 2016-10-19 NOTE — Patient Instructions (Signed)
  Your procedure is scheduled on: Tues. 10/24/16 Report to Day Surgery. To find out your arrival time please call [phone] between 1PM - 3PM on Mon. 10/23/16.  Remember: Instructions that are not followed completely may result in serious medical risk, up to and including death, or upon the discretion of your surgeon and anesthesiologist your surgery may need to be rescheduled.    ___x_ 1. Do not eat food or drink liquids after midnight. No gum chewing or hard candies.     __x__ 2. No Alcohol for 24 hours before or after surgery.   ____ 3. Do Not Smoke For 24 Hours Prior to Your Surgery.   ____ 4. Bring all medications with you on the day of surgery if instructed.    ___x_ 5. Notify your doctor if there is any change in your medical condition     (cold, fever, infections).       Do not wear jewelry, make-up, hairpins, clips or nail polish.  Do not wear lotions, powders, or perfumes. You may wear deodorant.  Do not shave 48 hours prior to surgery. Men may shave face and neck.  Do not bring valuables to the hospital.    [facility] is not responsible for any belongings or valuables.               Contacts, dentures or bridgework may not be worn into surgery.  Leave your suitcase in the car. After surgery it may be brought to your room.  For patients admitted to the hospital, discharge time is determined by your                treatment team.   Patients discharged the day of surgery will not be allowed to drive home.   Please read over the following fact sheets that you were given:      _x___ Take these medicines the morning of surgery with A SIP OF WATER:    1. famotidine (PEPCID) 10 MG tablet  2.   3.   4.  5.  6.  ____ Fleet Enema (as directed)   _x___ Use CHG Soap as directed  ____ Use inhalers on the day of surgery  ____ Stop metformin 2 days prior to surgery    ____ Take 1/2 of usual insulin dose the night before surgery and none on the morning of surgery.    ____ Stop Coumadin/Plavix/aspirin on   __x__ Stop Anti-inflammatories on tylenol only for pain until after surgery   ____ Stop supplements until after surgery.    ____ Bring C-Pap to the hospital.

## 2016-10-19 NOTE — Pre-Procedure Instructions (Signed)
12:59 Dr. Priscella MannPenwarden here and evaluated patient's lung sounds and current condition and no further studies ordered.  OK to proceed with surgery.

## 2016-10-20 MED ORDER — SUCCINYLCHOLINE CHLORIDE 20 MG/ML IJ SOLN
INTRAMUSCULAR | Status: AC
  Filled 2016-10-20: qty 1

## 2016-10-24 ENCOUNTER — Ambulatory Visit: Payer: Medicaid Other | Admitting: Anesthesiology

## 2016-10-24 ENCOUNTER — Encounter
Admission: RE | Disposition: A | Discharge status: Home / Self Care | Payer: Self-pay | Source: Ambulatory Visit | Attending: General Surgery

## 2016-10-24 ENCOUNTER — Encounter: Payer: Self-pay | Admitting: *Deleted

## 2016-10-24 ENCOUNTER — Ambulatory Visit: Payer: Medicaid Other

## 2016-10-24 ENCOUNTER — Ambulatory Visit
Admission: RE | Admit: 2016-10-24 | Discharge: 2016-10-24 | Disposition: A | Discharge status: Home / Self Care | Payer: Medicaid Other | Source: Ambulatory Visit | Attending: General Surgery | Admitting: General Surgery

## 2016-10-24 DIAGNOSIS — K589 Irritable bowel syndrome without diarrhea: Secondary | ICD-10-CM | POA: Diagnosis not present

## 2016-10-24 DIAGNOSIS — F840 Autistic disorder: Secondary | ICD-10-CM | POA: Insufficient documentation

## 2016-10-24 DIAGNOSIS — K8010 Calculus of gallbladder with chronic cholecystitis without obstruction: Secondary | ICD-10-CM | POA: Diagnosis not present

## 2016-10-24 DIAGNOSIS — E669 Obesity, unspecified: Secondary | ICD-10-CM | POA: Diagnosis not present

## 2016-10-24 DIAGNOSIS — K819 Cholecystitis, unspecified: Secondary | ICD-10-CM | POA: Diagnosis present

## 2016-10-24 DIAGNOSIS — Z6832 Body mass index (BMI) 32.0-32.9, adult: Secondary | ICD-10-CM | POA: Insufficient documentation

## 2016-10-24 HISTORY — PX: CHOLECYSTECTOMY: SHX55

## 2016-10-24 SURGERY — LAPAROSCOPIC CHOLECYSTECTOMY WITH INTRAOPERATIVE CHOLANGIOGRAM
Anesthesia: General | Wound class: Contaminated

## 2016-10-24 MED ORDER — DIPHENHYDRAMINE HCL 50 MG/ML IJ SOLN
INTRAMUSCULAR | Status: DC | PRN
  Administered 2016-10-24: 12.5 mg via INTRAVENOUS

## 2016-10-24 MED ORDER — FENTANYL CITRATE (PF) 100 MCG/2ML IJ SOLN
INTRAMUSCULAR | Status: DC | PRN
  Administered 2016-10-24: 50 ug via INTRAVENOUS
  Administered 2016-10-24: 100 ug via INTRAVENOUS
  Administered 2016-10-24: 50 ug via INTRAVENOUS

## 2016-10-24 MED ORDER — MIDAZOLAM HCL 2 MG/2ML IJ SOLN
INTRAMUSCULAR | Status: DC | PRN
  Administered 2016-10-24 (×2): 1 mg via INTRAVENOUS

## 2016-10-24 MED ORDER — ROCURONIUM BROMIDE 100 MG/10ML IV SOLN
INTRAVENOUS | Status: DC | PRN
  Administered 2016-10-24: 50 mg via INTRAVENOUS
  Administered 2016-10-24: 10 mg via INTRAVENOUS

## 2016-10-24 MED ORDER — BUPIVACAINE HCL (PF) 0.5 % IJ SOLN
INTRAMUSCULAR | Status: AC
  Filled 2016-10-24: qty 30

## 2016-10-24 MED ORDER — LACTATED RINGERS IV SOLN
INTRAVENOUS | Status: DC
  Administered 2016-10-24 (×2): via INTRAVENOUS

## 2016-10-24 MED ORDER — LIDOCAINE HCL (PF) 1 % IJ SOLN
INTRAMUSCULAR | Status: AC
  Filled 2016-10-24: qty 30

## 2016-10-24 MED ORDER — ACETAMINOPHEN 10 MG/ML IV SOLN
INTRAVENOUS | Status: AC
  Filled 2016-10-24: qty 100

## 2016-10-24 MED ORDER — OXYCODONE HCL 5 MG PO TABS
5.0000 mg | ORAL_TABLET | Freq: Once | ORAL | Status: AC | PRN
  Administered 2016-10-24: 5 mg via ORAL

## 2016-10-24 MED ORDER — ACETAMINOPHEN 10 MG/ML IV SOLN
INTRAVENOUS | Status: DC | PRN
  Administered 2016-10-24: 1000 mg via INTRAVENOUS

## 2016-10-24 MED ORDER — OXYCODONE HCL 5 MG PO TABS
ORAL_TABLET | ORAL | Status: AC
  Filled 2016-10-24: qty 1

## 2016-10-24 MED ORDER — SUGAMMADEX SODIUM 200 MG/2ML IV SOLN
INTRAVENOUS | Status: DC | PRN
  Administered 2016-10-24: 179.6 mg via INTRAVENOUS

## 2016-10-24 MED ORDER — CIPROFLOXACIN IN D5W 400 MG/200ML IV SOLN
400.0000 mg | INTRAVENOUS | Status: AC
  Administered 2016-10-24: 400 mg via INTRAVENOUS

## 2016-10-24 MED ORDER — PROPOFOL 10 MG/ML IV BOLUS
INTRAVENOUS | Status: DC | PRN
  Administered 2016-10-24: 180 mg via INTRAVENOUS

## 2016-10-24 MED ORDER — FENTANYL CITRATE (PF) 100 MCG/2ML IJ SOLN
INTRAMUSCULAR | Status: AC
  Filled 2016-10-24: qty 2

## 2016-10-24 MED ORDER — ONDANSETRON HCL 4 MG/2ML IJ SOLN
INTRAMUSCULAR | Status: DC | PRN
  Administered 2016-10-24: 4 mg via INTRAVENOUS

## 2016-10-24 MED ORDER — PROMETHAZINE HCL 25 MG/ML IJ SOLN: 6.2500 mg | INTRAMUSCULAR | Status: DC | PRN

## 2016-10-24 MED ORDER — MEPERIDINE HCL 25 MG/ML IJ SOLN: 6.2500 mg | INTRAMUSCULAR | Status: DC | PRN

## 2016-10-24 MED ORDER — CIPROFLOXACIN IN D5W 400 MG/200ML IV SOLN
INTRAVENOUS | Status: AC
  Administered 2016-10-24: 400 mg via INTRAVENOUS
  Filled 2016-10-24: qty 200

## 2016-10-24 MED ORDER — IOTHALAMATE MEGLUMINE 60 % INJ SOLN
INTRAMUSCULAR | Status: DC | PRN
  Administered 2016-10-24: 20 mL

## 2016-10-24 MED ORDER — MIDAZOLAM HCL 2 MG/2ML IJ SOLN: INTRAMUSCULAR | Status: DC | PRN

## 2016-10-24 MED ORDER — CHLORHEXIDINE GLUCONATE CLOTH 2 % EX PADS
6.0000 | MEDICATED_PAD | Freq: Once | CUTANEOUS | Status: AC
  Administered 2016-10-24: 6 via TOPICAL

## 2016-10-24 MED ORDER — OXYCODONE HCL 5 MG/5ML PO SOLN: 5.0000 mg | Freq: Once | ORAL | Status: AC | PRN

## 2016-10-24 MED ORDER — FENTANYL CITRATE (PF) 100 MCG/2ML IJ SOLN
25.0000 ug | INTRAMUSCULAR | Status: DC | PRN
  Administered 2016-10-24: 50 ug via INTRAVENOUS
  Administered 2016-10-24 (×2): 25 ug via INTRAVENOUS

## 2016-10-24 MED ORDER — CHLORHEXIDINE GLUCONATE CLOTH 2 % EX PADS
6.0000 | MEDICATED_PAD | Freq: Once | CUTANEOUS | Status: AC
  Administered 2016-10-23: 6 via TOPICAL

## 2016-10-24 SURGICAL SUPPLY — 48 items
ADHESIVE MASTISOL STRL (MISCELLANEOUS) ×3 IMPLANT
APPLIER CLIP ROT 10 11.4 M/L (STAPLE) ×3
BAG COUNTER SPONGE EZ (MISCELLANEOUS) ×2 IMPLANT
BLADE SURG SZ11 CARB STEEL (BLADE) ×3 IMPLANT
CANISTER SUCT 1200ML W/VALVE (MISCELLANEOUS) ×3 IMPLANT
CATH CHOLANG 76X19 KUMAR (CATHETERS) ×3 IMPLANT
CHLORAPREP W/TINT 26ML (MISCELLANEOUS) ×3 IMPLANT
CLIP APPLIE ROT 10 11.4 M/L (STAPLE) ×1 IMPLANT
CLOSURE WOUND 1/2 X4 (GAUZE/BANDAGES/DRESSINGS) ×1
CLOSURE WOUND 1/4X4 (GAUZE/BANDAGES/DRESSINGS) ×1
CONRAY 60ML FOR OR (MISCELLANEOUS) IMPLANT
COUNTER SPONGE BAG EZ (MISCELLANEOUS) ×1
DRAPE SHEET LG 3/4 BI-LAMINATE (DRAPES) ×3 IMPLANT
DRESSING TELFA 4X3 1S ST N-ADH (GAUZE/BANDAGES/DRESSINGS) ×3 IMPLANT
DRSG TEGADERM 2-3/8X2-3/4 SM (GAUZE/BANDAGES/DRESSINGS) ×21 IMPLANT
DRSG TEGADERM 4X4.75 (GAUZE/BANDAGES/DRESSINGS) ×3 IMPLANT
ELECT REM PT RETURN 9FT ADLT (ELECTROSURGICAL) ×3
ELECTRODE REM PT RTRN 9FT ADLT (ELECTROSURGICAL) ×1 IMPLANT
GLOVE BIO SURGEON STRL SZ7.5 (GLOVE) ×3 IMPLANT
GLOVE INDICATOR 8.0 STRL GRN (GLOVE) ×3 IMPLANT
GOWN STRL REUS W/ TWL LRG LVL3 (GOWN DISPOSABLE) ×3 IMPLANT
GOWN STRL REUS W/TWL LRG LVL3 (GOWN DISPOSABLE) ×6
GRASPER SUT TROCAR 14GX15 (MISCELLANEOUS) ×3 IMPLANT
IRRIGATION STRYKERFLOW (MISCELLANEOUS) IMPLANT
IRRIGATOR STRYKERFLOW (MISCELLANEOUS)
IV NS 1000ML (IV SOLUTION)
IV NS 1000ML BAXH (IV SOLUTION) IMPLANT
L-HOOK LAP DISP 36CM (ELECTROSURGICAL) ×3
LABEL OR SOLS (LABEL) ×3 IMPLANT
LHOOK LAP DISP 36CM (ELECTROSURGICAL) ×1 IMPLANT
NEEDLE HYPO 25X1 1.5 SAFETY (NEEDLE) ×3 IMPLANT
NEEDLE VERESS 14GA 120MM (NEEDLE) ×3 IMPLANT
NS IRRIG 500ML POUR BTL (IV SOLUTION) ×3 IMPLANT
PACK LAP CHOLECYSTECTOMY (MISCELLANEOUS) ×3 IMPLANT
PENCIL ELECTRO HAND CTR (MISCELLANEOUS) ×3 IMPLANT
POUCH ENDO CATCH 10MM SPEC (MISCELLANEOUS) ×3 IMPLANT
SCISSORS METZENBAUM CVD 33 (INSTRUMENTS) ×3 IMPLANT
SLEEVE ENDOPATH XCEL 5M (ENDOMECHANICALS) ×6 IMPLANT
SLEEVE PROTECTION STRL DISP (MISCELLANEOUS) ×3 IMPLANT
STRIP CLOSURE SKIN 1/2X4 (GAUZE/BANDAGES/DRESSINGS) ×2 IMPLANT
STRIP CLOSURE SKIN 1/4X4 (GAUZE/BANDAGES/DRESSINGS) ×2 IMPLANT
SUT MNCRL 4-0 (SUTURE) ×2
SUT MNCRL 4-0 27XMFL (SUTURE) ×1
SUT VICRYL 0 AB UR-6 (SUTURE) ×3 IMPLANT
SUTURE MNCRL 4-0 27XMF (SUTURE) ×1 IMPLANT
TROCAR XCEL 12X100 BLDLESS (ENDOMECHANICALS) ×3 IMPLANT
TROCAR XCEL NON-BLD 5MMX100MML (ENDOMECHANICALS) ×3 IMPLANT
TUBING INSUFFLATOR HI FLOW (MISCELLANEOUS) ×3 IMPLANT

## 2016-10-24 NOTE — Anesthesia Procedure Notes (Signed)
Procedure Name: Intubation Date/Time: 10/24/2016 9:05 AM Performed by: Henrietta HooverPOPE, Dmani Mizer Pre-anesthesia Checklist: Patient identified, Suction available, Patient being monitored, Timeout performed and Emergency Drugs available Patient Re-evaluated:Patient Re-evaluated prior to inductionOxygen Delivery Method: Circle system utilized Preoxygenation: Pre-oxygenation with 100% oxygen Intubation Type: IV induction Ventilation: Mask ventilation without difficulty Laryngoscope Size: Mac and 4 Grade View: Grade I Tube type: Oral Tube size: 7.5 mm Number of attempts: 1 Airway Equipment and Method: Stylet Placement Confirmation: ETT inserted through vocal cords under direct vision,  positive ETCO2 and breath sounds checked- equal and bilateral Secured at: 22 cm Tube secured with: Tape Dental Injury: Teeth and Oropharynx as per pre-operative assessment

## 2016-10-24 NOTE — Op Note (Signed)
Laparoscopic Cholecystectomy  Pre-operative Diagnosis: Previous cholecystitis  Post-operative Diagnosis: Same  Procedure: Laparoscopic cholecystectomy with intraoperative cholangiogram  Surgeon: Leonette Mostharles T. Tonita CongWoodham, MD FACS  Anesthesia: Gen. with endotracheal tube  Assistant: PA student  Procedure Details  The patient was seen again in the Holding Room. The benefits, complications, treatment options, and expected outcomes were discussed with the patient. The risks of bleeding, infection, recurrence of symptoms, failure to resolve symptoms, bile duct damage, bile duct leak, retained common bile duct stone, bowel injury, any of which could require further surgery and/or ERCP, stent, or papillotomy were reviewed with the patient. The likelihood of improving the patient's symptoms with return to their baseline status is good.  The patient and/or family concurred with the proposed plan, giving informed consent.  The patient was taken to Operating Room, identified as Joshua [name] and the procedure verified as Laparoscopic Cholecystectomy.  A Time Out was held and the above information confirmed.  Prior to the induction of general anesthesia, antibiotic prophylaxis was administered. VTE prophylaxis was in place. General endotracheal anesthesia was then administered and tolerated well. After the induction, the abdomen was prepped with Chloraprep and draped in the sterile fashion. The patient was positioned in the supine position.  Local anesthetic  was injected into the skin near the umbilicus and an incision made. The Veress needle was placed but was unable to clearly access the peritoneal cavity. A 5mm Optiview port was placed in the periumbilical position and the abdominal cavity was explored. Pneumoperitoneum was then created with CO2 and tolerated well without any adverse changes in the patient's vital signs.  Two 5-mm ports were placed in the right upper quadrant and a 12 mm epigastric port was  placed all under direct vision. All skin incisions  were infiltrated with a local anesthetic agent before making the incision and placing the trocars.   The patient was positioned  in reverse Trendelenburg, tilted slightly to the patient's left.  Numerous inflammatory adhesions between the omentum and liver to the anterior abdominal wall were immediately noticed. The gallbladder was identified, the fundus grasped and retracted cephalad. Adhesions were lysed bluntly. The infundibulum was grasped and retracted laterally, exposing the peritoneum overlying the triangle of Calot. This was then divided and exposed in a blunt fashion. A critical view of the cystic duct and cystic artery was obtained.  The cystic duct was clearly identified and bluntly dissected.   At this point a cholangiogram was performed using the Kumar clamp and catheter. The catheter was inserted into the infundibulum and under fluoroscopy Conray was instilled. The contrast immediately flew through the long cystic duct into the common duct. It then immediately flowed into the small bowel and reflux into the right and left hepatic ducts. This confirmed a normal cholangiogram and the catheter and clamp were removed under direct visualization.  The cystic duct and artery were then serially clipped with endoclips and cut between with Endo Shears. The gallbladder was taken from the gallbladder fossa in a retrograde fashion with the electrocautery. The previously placed cholecystostomy tube was identified entering into the posterior wall of the gallbladder. The gallbladder was freed from all its attachments the liver and removed from the drain. The gallbladder was removed and placed in an Endocatch bag. The cholecystostomy tube was then cut and removed from the abdomen under direct visualization. The liver bed was irrigated and inspected. Hemostasis was achieved with the electrocautery. Copious irrigation was utilized and was repeatedly aspirated  until clear.  The gallbladder and  Endocatch sac were then removed through the epigastric port site. The epigastric port site had to be widened bluntly for the gallbladder to be removed.  Inspection of the right upper quadrant was performed. No bleeding, bile duct injury or leak, or bowel injury was noted. Pneumoperitoneum was released.  The epigastric port site was closed with figure-of-eight 0 Vicryl sutures. 4-0 subcuticular Monocryl was used to close the skin. Steristrips and Mastisol and sterile dressings were  applied.  The patient was then extubated and brought to the recovery room in stable condition. Sponge, lap, and needle counts were correct at closure and at the conclusion of the case.   Findings: Previous acute Cholecystitis   Estimated Blood Loss: 10 mL         Drains: None         Specimens: Gallbladder           Complications: none               Angelyna Henderson T. Tonita CongWoodham, MD, FACS

## 2016-10-24 NOTE — Transfer of Care (Signed)
Immediate Anesthesia Transfer of Care Note  Patient: Ronnie DerbyCody N Wadas  Procedure(s) Performed: Procedure(s): LAPAROSCOPIC CHOLECYSTECTOMY WITH INTRAOPERATIVE CHOLANGIOGRAM (N/A)  Patient Location: PACU  Anesthesia Type:General  Level of Consciousness: sedated  Airway & Oxygen Therapy: Patient Spontanous Breathing and Patient connected to face mask oxygen  Post-op Assessment: Report given to RN and Post -op Vital signs reviewed and stable  Post vital signs: Reviewed and stable  Last Vitals:  Vitals:   10/24/16 0824  BP: 134/89  Pulse: 83  Resp: 20  Temp: 36.9 C    Last Pain:  Vitals:   10/24/16 0824  PainSc: 0-No pain      Patients Stated Pain Goal: 2 (10/24/16 0824)  Complications: No apparent anesthesia complications

## 2016-10-24 NOTE — Interval H&P Note (Signed)
History and Physical Interval Note:  10/24/2016 8:40 AM  Joshua [name]  has presented today for surgery, with the diagnosis of cholecystostomy  The various methods of treatment have been discussed with the patient and family. After consideration of risks, benefits and other options for treatment, the patient has consented to  Procedure(s): LAPAROSCOPIC CHOLECYSTECTOMY WITH INTRAOPERATIVE CHOLANGIOGRAM (N/A) as a surgical intervention .  The patient's history has been reviewed, patient examined, no change in status, stable for surgery.  I have reviewed the patient's chart and labs.  Questions were answered to the patient's satisfaction.     Ricarda Frameharles Damarko Stitely

## 2016-10-24 NOTE — H&P (View-Only) (Signed)
Outpatient Surgical Follow Up  10/10/2016  Joshua [name] is an 20 y.o. male.   Chief Complaint  Patient presents with  . Follow-up    Pneumonia    HPI: 20 year old male returns to clinic for follow-up from a colostomy tube placement secondary to Klebsiella pneumonia. Patient reports feeling much better. He denies any fevers, chills, nausea, vomiting, chest pain, shortness breath, diarrhea, constipation. He's been eating well, he has been breathing well, he isn't having normal levels of activity. His drain continues to have a bilious output and is not causing him much discomfort. Patient and his mother strongly desired to have his gallbladder removed as soon as safely possible so that the drain can be removed. Patient has had a repeat chest x-ray since his last visit and he continues to be on antibiotics. Per infectious disease it was recommended he stay on antibiotics until his gallbladder is removed.  Past Medical History:  Diagnosis Date  . Autism   . Irritable bowel syndrome     Past Surgical History:  Procedure Laterality Date  . EYE SURGERY    . IR GENERIC HISTORICAL  09/20/2016   IR PERC CHOLECYSTOSTOMY 09/20/2016 Simonne ComeJohn Watts, MD [facility] RAD    Family History  Problem Relation Age of Onset  . Healthy Mother   . Healthy Father     Social History:  reports that he has never smoked. He has never used smokeless tobacco. He reports that he does not drink alcohol or use drugs.  Allergies: No Known Allergies  Medications reviewed.    ROS A multipoint review of systems was completed, all pertinent positives and negatives are documented within the history of present illness and remainder are negative.   BP 133/77   Pulse 84   Temp 98.1 F (36.7 C) (Oral)   Wt 89.8 kg (198 lb)   BMI 38.67 kg/m   Physical Exam Gen.: No acute distress Neck: Supple and nontender Lymph nodes: No evidence of cervical or clavicular lymphadenopathy Chest: Clear to auscultation with  equal wall motion. Heart: Regular rate and rhythm Abdomen: Soft, nontender, nondistended. Cholecystostomy tube in place into the right upper quadrant with a bilious output. There is no erythema or drainage present from around the tube. Extremities: Moves all extremities well    No results found for this or any previous visit (from the past 48 hour(s)). No results found.  Assessment/Plan:  1. Cholecystostomy care [facility](HCC) 20 year old male with a colostomy tube in place secondary to acute cholecystitis. Discussed with the patient and mother that it is time to plan his cholecystectomy.I discussed the procedure in detail.  The patient was given Agricultural engineereducational material.  We discussed the risks and benefits of a laparoscopic cholecystectomy and possible cholangiogram including, but not limited to bleeding, infection, injury to surrounding structures such as the intestine or liver, bile leak, retained gallstones, need to convert to an open procedure, prolonged diarrhea, blood clots such as  DVT, common bile duct injury, anesthesia risks, and possible need for additional procedures.  The likelihood of improvement in symptoms and return to the patient's normal status is good. We discussed the typical post-operative recovery course. They voiced understanding and desired to proceed. We'll tentatively plan to proceed for November 14.   2. Pneumonia of right lung due to Klebsiella pneumoniae, unspecified part of lung (HCC) Patient required continued antibiotics secondary to Klebsiella pneumonia. X-rays currently clear. Due to his prolonged hospital stay due to this complication discussed that he will require and in person anesthesia visit  prior to his day of surgery. Plan to continue his antibiotics until after his gallbladder is removed.     Ricarda Frameharles Frieda Arnall, MD FACS General Surgeon  10/10/2016,10:24 AM

## 2016-10-24 NOTE — Brief Op Note (Signed)
10/24/2016  10:35 AM  PATIENT:  Joshua [name]  20 y.o. male  PRE-OPERATIVE DIAGNOSIS:  cholecystostomy  POST-OPERATIVE DIAGNOSIS:  cholecystecomy   PROCEDURE:  Procedure(s): LAPAROSCOPIC CHOLECYSTECTOMY WITH INTRAOPERATIVE CHOLANGIOGRAM (N/A)  SURGEON:  Surgeon(s) and Role:    * Ricarda Frameharles Boluwatife Mutchler, MD - Primary  PHYSICIAN ASSISTANT:   ASSISTANTS: PA Student  ANESTHESIA:   general  EBL:  Total I/O In: 250 [I.V.:250] Out: 10 [Blood:10]  BLOOD ADMINISTERED:none  DRAINS: none   LOCAL MEDICATIONS USED:  MARCAINE   , XYLOCAINE  and Amount: 27 ml  SPECIMEN:  Source of Specimen:  gallbladder  DISPOSITION OF SPECIMEN:  PATHOLOGY  COUNTS:  YES  TOURNIQUET:  * No tourniquets in log *  DICTATION: .Dragon Dictation  PLAN OF CARE: Discharge to home after PACU  PATIENT DISPOSITION:  PACU - hemodynamically stable.   Delay start of Pharmacological VTE agent (>24hrs) due to surgical blood loss or risk of bleeding: no

## 2016-10-24 NOTE — OR Nursing (Signed)
3 dressings changed that has sm amt of blood- supplies given to family for another dressing change

## 2016-10-24 NOTE — Anesthesia Postprocedure Evaluation (Signed)
Anesthesia Post Note  Patient: Ronnie DerbyCody N Dotter  Procedure(s) Performed: Procedure(s) (LRB): LAPAROSCOPIC CHOLECYSTECTOMY WITH INTRAOPERATIVE CHOLANGIOGRAM (N/A)  Patient location during evaluation: PACU Anesthesia Type: General Level of consciousness: awake and alert Pain management: pain level controlled Vital Signs Assessment: post-procedure vital signs reviewed and stable Respiratory status: spontaneous breathing, nonlabored ventilation, respiratory function stable and patient connected to nasal cannula oxygen Cardiovascular status: blood pressure returned to baseline and stable Postop Assessment: no signs of nausea or vomiting Anesthetic complications: no    Last Vitals:  Vitals:   10/24/16 1100 10/24/16 1105  BP:  (!) 147/94  Pulse: (!) 106 (!) 102  Resp: 17 13  Temp:      Last Pain:  Vitals:   10/24/16 1100  PainSc: 5                  Yevette EdwardsJames G Revecca Nachtigal

## 2016-10-24 NOTE — Anesthesia Preprocedure Evaluation (Signed)
Anesthesia Evaluation  Patient identified by MRN, date of birth, ID band Patient awake    Reviewed: Allergy & Precautions, NPO status , Patient's Chart, lab work & pertinent test results  History of Anesthesia Complications Negative for: history of anesthetic complications  Airway Mallampati: II  TM Distance: >3 FB Neck ROM: Full    Dental no notable dental hx.    Pulmonary neg pulmonary ROS, neg sleep apnea, neg COPD,    breath sounds clear to auscultation- rhonchi (-) wheezing      Cardiovascular Exercise Tolerance: Good (-) hypertension(-) CAD and (-) Past MI  Rhythm:Regular Rate:Normal - Systolic murmurs and - Diastolic murmurs    Neuro/Psych negative neurological ROS     GI/Hepatic Neg liver ROS, IBS   Endo/Other  negative endocrine ROSneg diabetes  Renal/GU negative Renal ROS     Musculoskeletal negative musculoskeletal ROS (+)   Abdominal (+) + obese,   Peds  Hematology negative hematology ROS (+)   Anesthesia Other Findings   Reproductive/Obstetrics                             Anesthesia Physical Anesthesia Plan  ASA: II  Anesthesia Plan: General   Post-op Pain Management:    Induction: Intravenous  Airway Management Planned: Oral ETT  Additional Equipment:   Intra-op Plan:   Post-operative Plan: Extubation in OR  Informed Consent: I have reviewed the patients History and Physical, chart, labs and discussed the procedure including the risks, benefits and alternatives for the proposed anesthesia with the patient or authorized representative who has indicated his/her understanding and acceptance.   Dental advisory given  Plan Discussed with: CRNA and Anesthesiologist  Anesthesia Plan Comments:         Anesthesia Quick Evaluation

## 2016-10-24 NOTE — Discharge Instructions (Signed)
AMBULATORY SURGERY  DISCHARGE INSTRUCTIONS   1) The drugs that you were given will stay in your system until tomorrow so for the next 24 hours you should not:  A) Drive an automobile B) Make any legal decisions C) Drink any alcoholic beverage   2) You may resume regular meals tomorrow.  Today it is better to start with liquids and gradually work up to solid foods.  You may eat anything you prefer, but it is better to start with liquids, then soup and crackers, and gradually work up to solid foods.   3) Please notify your doctor immediately if you have any unusual bleeding, trouble breathing, redness and pain at the surgery site, drainage, fever, or pain not relieved by medication.  4) Your post-operative visit with Dr.                                     is: Date:                        Time:    Please call to schedule your post-operative visit.  5) Additional Instructions: Laparoscopic Cholecystectomy, Care After This sheet gives you information about how to care for yourself after your procedure. Your health care provider may also give you more specific instructions. If you have problems or questions, contact your health care provider. What can I expect after the procedure? After the procedure, it is common to have:  Pain at your incision sites. You will be given medicines to control this pain.  Mild nausea or vomiting.  Bloating and possible shoulder pain from the air-like gas that was used during the procedure. Follow these instructions at home: Incision care  Follow instructions from your health care provider about how to take care of your incisions. Make sure you:  Wash your hands with soap and water before you change your bandage (dressing). If soap and water are not available, use hand sanitizer.  Change your dressing as told by your health care provider. Remove initial dressings in 48 hours, replace as needed until all drainage stops.  Leave stitches (sutures),  skin glue, or adhesive strips in place. These skin closures may need to be in place for 2 weeks or longer. If adhesive strip edges start to loosen and curl up, you may trim the loose edges. Do not remove adhesive strips completely unless your health care provider tells you to do that.  Do not take baths, swim, or use a hot tub until your health care provider approves. Ask your health care provider if you can take showers. You may only be allowed to take sponge baths for bathing. OK to shower in 24 hours. Bandages may get wet.  Check your incision area every day for signs of infection. Check for:  More redness, swelling, or pain.  More fluid or blood.  Warmth.  Pus or a bad smell. Activity  Do not drive or use heavy machinery while taking prescription pain medicine.  Do not lift anything that is heavier than 10 lb (4.5 kg) until your health care provider approves.  Do not play contact sports until your health care provider approves.  Do not drive for 24 hours if you were given a medicine to help you relax (sedative).  Rest as needed. Do not return to work or school until your health care provider approves. General instructions  Take over-the-counter and prescription  medicines only as told by your health care provider.  To prevent or treat constipation while you are taking prescription pain medicine, your health care provider may recommend that you:  Drink enough fluid to keep your urine clear or pale yellow.  Take over-the-counter or prescription medicines.  Eat foods that are high in fiber, such as fresh fruits and vegetables, whole grains, and beans.  Limit foods that are high in fat and processed sugars, such as fried and sweet foods. Contact a health care provider if:  You develop a rash.  You have more redness, swelling, or pain around your incisions.  You have more fluid or blood coming from your incisions.  Your incisions feel warm to the touch.  You have pus or a  bad smell coming from your incisions.  You have a fever.  One or more of your incisions breaks open. Get help right away if:  You have trouble breathing.  You have chest pain.  You have increasing pain in your shoulders.  You faint or feel dizzy when you stand.  You have severe pain in your abdomen.  You have nausea or vomiting that lasts for more than one day.  You have leg pain. This information is not intended to replace advice given to you by your health care provider. Make sure you discuss any questions you have with your health care provider. Document Released: 11/27/2005 Document Revised: 06/17/2016 Document Reviewed: 05/15/2016 Elsevier Interactive Patient Education  2017 [employer]

## 2016-10-25 ENCOUNTER — Encounter: Payer: Self-pay | Admitting: General Surgery

## 2016-10-25 LAB — SURGICAL PATHOLOGY

## 2016-11-07 ENCOUNTER — Encounter: Payer: Self-pay | Admitting: Surgery

## 2016-11-07 ENCOUNTER — Ambulatory Visit (INDEPENDENT_AMBULATORY_CARE_PROVIDER_SITE_OTHER): Payer: Medicaid Other | Admitting: Surgery

## 2016-11-07 VITALS — BP 142/76 | HR 62 | Temp 98.2°F | Wt 199.0 lb

## 2016-11-07 DIAGNOSIS — K8000 Calculus of gallbladder with acute cholecystitis without obstruction: Secondary | ICD-10-CM

## 2016-11-07 NOTE — Progress Notes (Signed)
Outpatient Surgical Follow Up  11/07/2016  Joshua [name] is an 20 y.o. male seen for the diagnosis of Acute calculous cholecystitis [K80.00].  HPI: Patient seen and examined in clinic. He underwent a laparoscopic cholecystectomy on 11/14 with Dr. Tonita CongWoodham patient had previously had a complicated course required cholecystostomy tube and a right lower lobe pneumonia as well. Overall he is doing well with no acute complaints. he denies N/V, D/C, fevers or malaise.  He states that his appetite is good and he is eating well. Patient states that his bowel movements are good and he does not have any diarrhea stools but sometimes does have to go right after he eats. Patient is not needing any medication for pain only some Tylenol occasionally. Patient does state that he still has some pain in his right lower chest when he yawns but not when taking a deep breath and has not had any coughing or any fevers or chills.  Past Medical History:  Diagnosis Date  . Autism   . Irritable bowel syndrome     Past Surgical History:  Procedure Laterality Date  . BILIARY DRAINAGE  2017  . CHOLECYSTECTOMY N/A 10/24/2016   Procedure: LAPAROSCOPIC CHOLECYSTECTOMY WITH INTRAOPERATIVE CHOLANGIOGRAM;  Surgeon: Ricarda Frameharles Woodham, MD;  Location: [facility] ORS;  Service: General;  Laterality: N/A;  . EYE SURGERY     leftt eye RAP  . IR GENERIC HISTORICAL  09/20/2016   IR PERC CHOLECYSTOSTOMY 09/20/2016 Simonne ComeJohn Watts, MD [facility]-INTER[facility] RAD    Family History  Problem Relation Age of Onset  . Healthy Mother   . Healthy Father     Social History:  reports that he has never smoked. He has never used smokeless tobacco. He reports that he does not drink alcohol or use drugs.  Allergies: No Known Allergies  Medications reviewed.  Physical Exam:  BP (!) 142/76   Pulse 62   Temp 98.2 F (36.8 C) (Oral)   Wt 199 lb (90.3 kg)   BMI 33.12 kg/m   Gen: patient resting comfortably in clinic, no cardiovascular or respiratory  distress Res: CTAB/L, no wheezes, rales or crackles  Abd/GI: soft, non-tender, incisions c/d/i, no erythema or drainage  No results found for this or any previous visit (from the past 48 hour(s)). No results found.  Assessment/Plan: Joshua DerbyCody N [name] is an 20 y.o. male seen for the diagnosis of Acute calculous cholecystitis [K80.00]. Progressing as expected.  I discussed with the patient is mother that he is healing well and can now abated that he needs. 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 Also discussed that he can go back to exercise or lifting over 15-20 pounds next week. The patient can call with any questions or concerns.  Jaiven Graveline L. Clifton Safley MD General Surgeon  11/07/2016,11:00 AM

## 2016-11-07 NOTE — Patient Instructions (Signed)

## 2016-12-19 IMAGING — CR DG CHOLANGIOGRAM OPERATIVE
8 series · 15 of 40 positions shown · non-contrast
Comparison: CT 09/23/2016 and previous

CLINICAL DATA: Cholecystitis. Percutaneous cholecystostomy tube
placed 09/20/2016.

EXAM:
INTRAOPERATIVE CHOLANGIOGRAM
TECHNIQUE: Cholangiographic images from the C-arm fluoroscopic device were
submitted for interpretation post-operatively. Please see the
procedural report for the amount of contrast and the fluoroscopy
time utilized.

[cont. (1 of 8)]
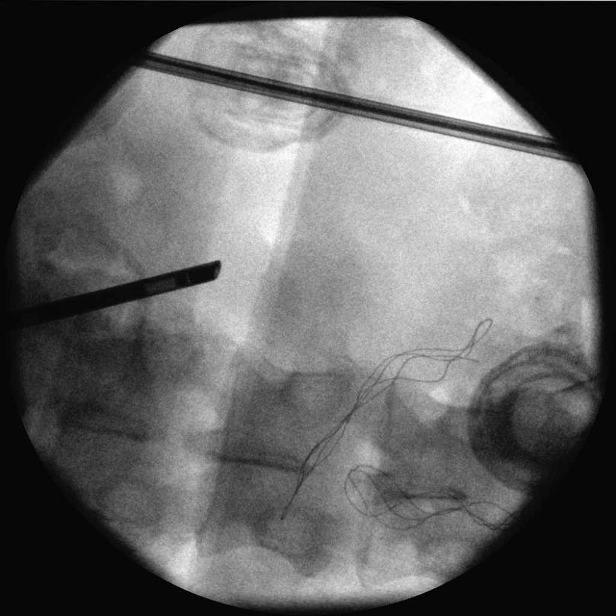

[cont. (2 of 8)]
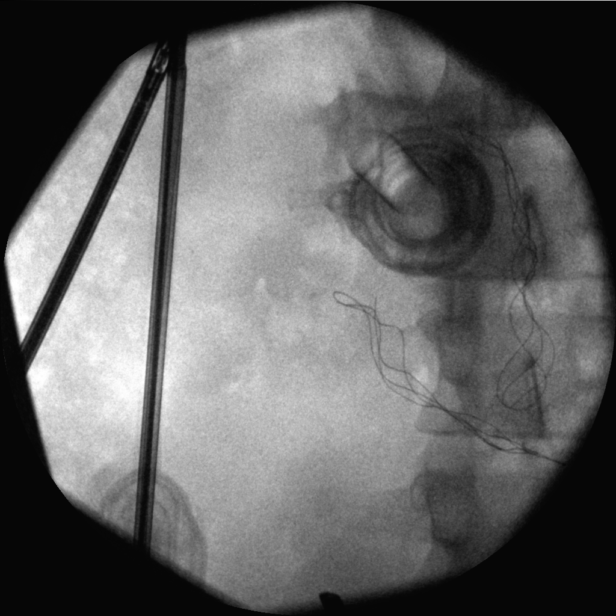

[Series 3: cont. · 2 of 4 frames shown (3 of 8)]
[frame 1/4]
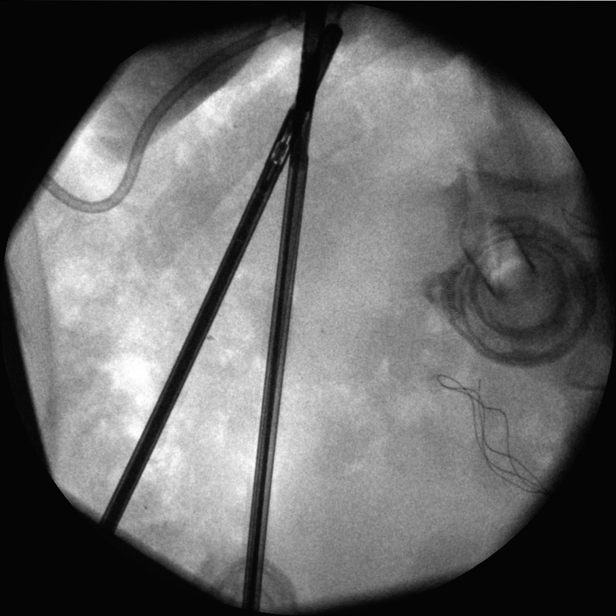
[frame 4/4]
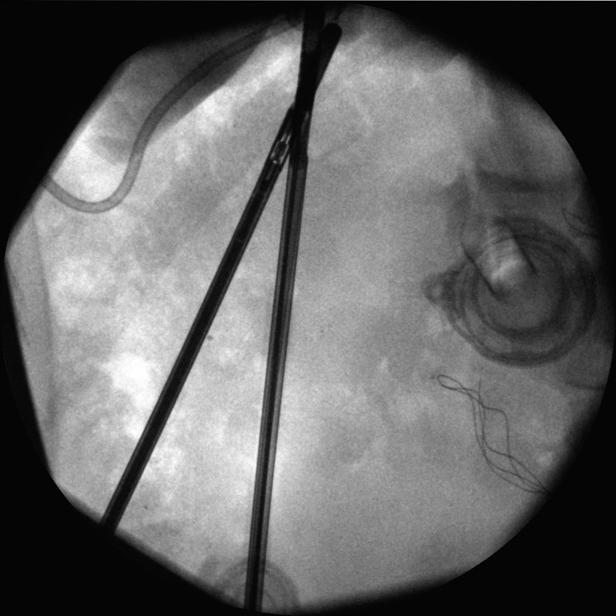

[Series 4: cont. · 2 of 5 frames shown (4 of 8)]
[frame 3/5]
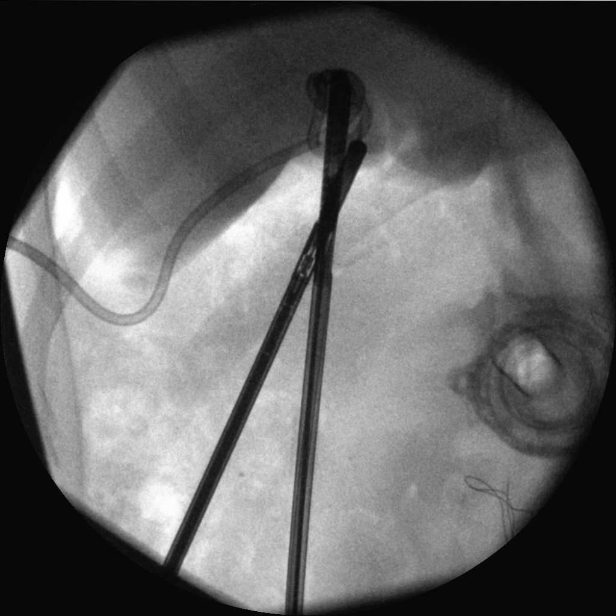
[frame 5/5]
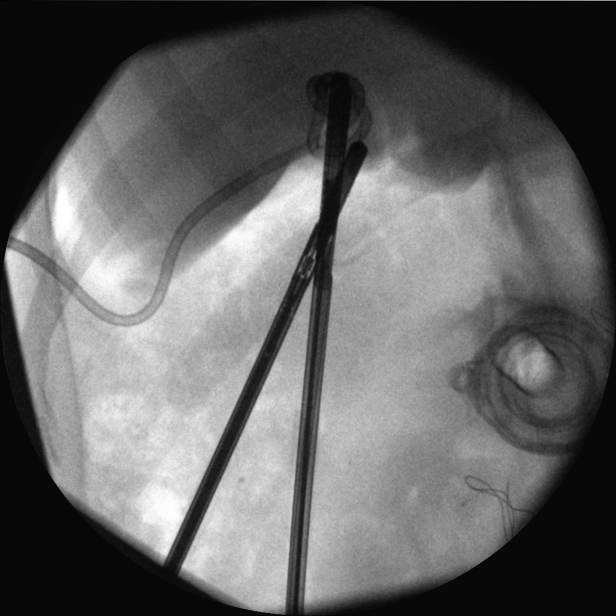

[Series 5: cont. · 3 of 37 frames shown (5 of 8)]
[frame 9/37]
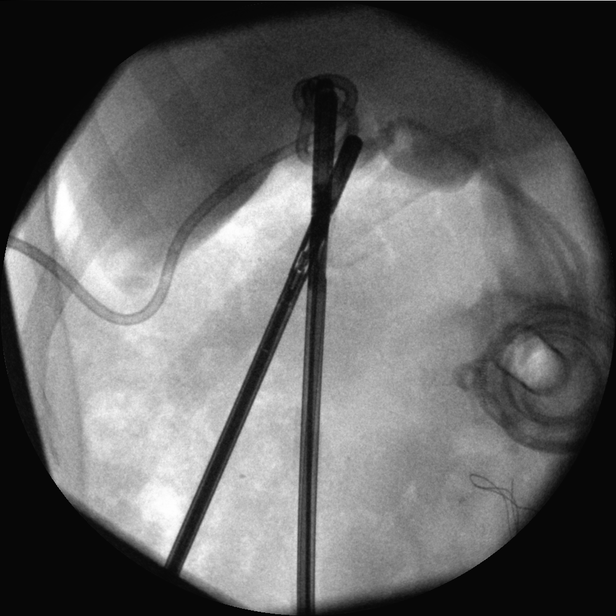
[frame 21/37]
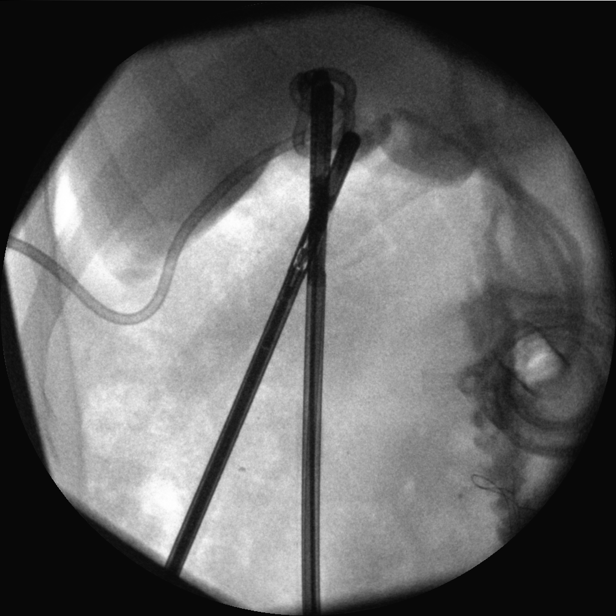
[frame 29/37]
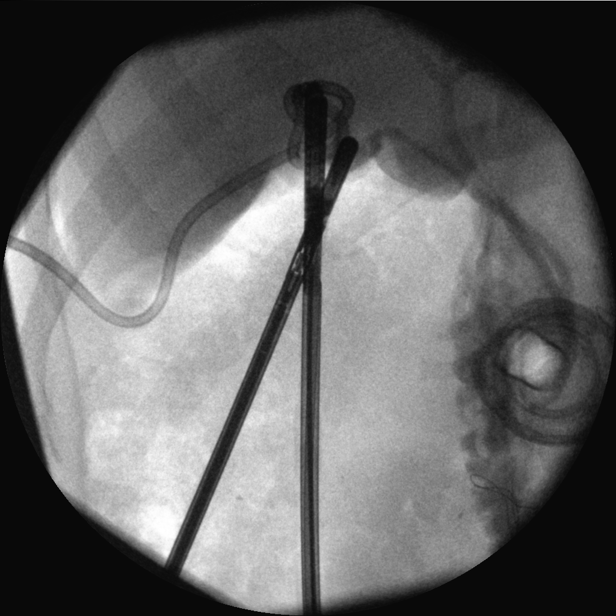

[cont. (6 of 8)]
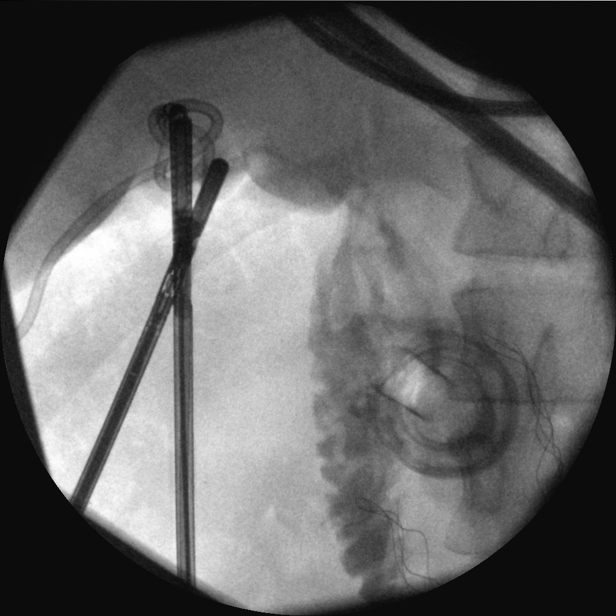

[cont. (7 of 8)]
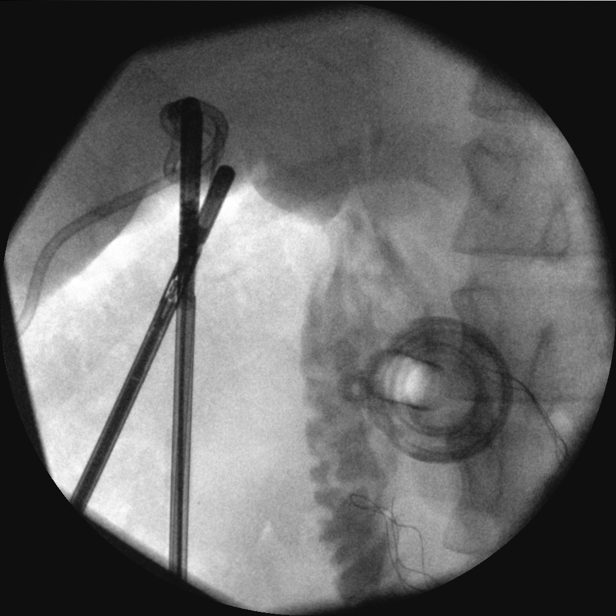

[Series 8: cont. · 4 of 24 frames shown (8 of 8)]
[frame 1/24]
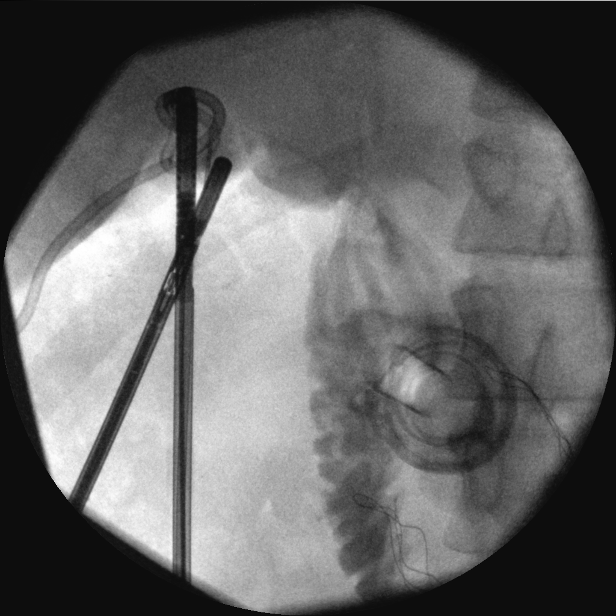
[frame 8/24]
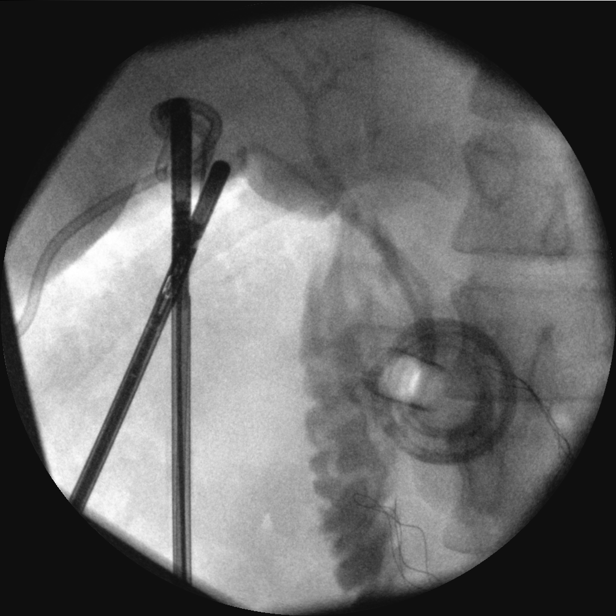
[frame 16/24]
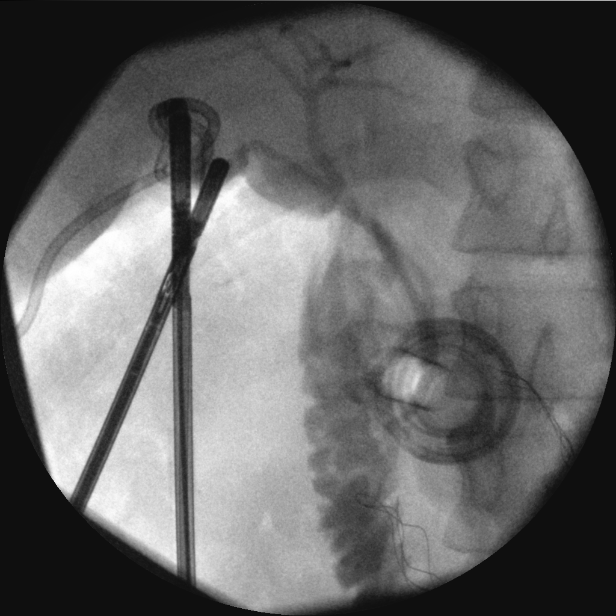
[frame 21/24]
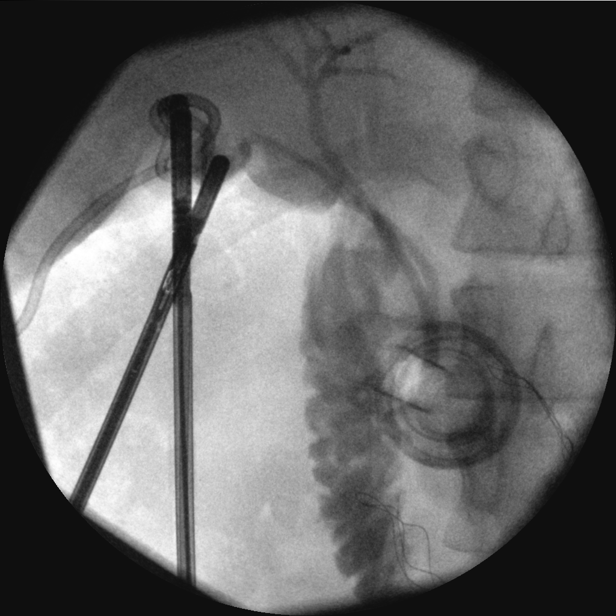

[15 of 40 positions shown; findings below may reference images not displayed]

FINDINGS: No persistent filling defects in the common duct. Intrahepatic ducts
are incompletely visualized, appearing decompressed centrally.
Contrast passes into the duodenum. Percutaneous cholecystostomy
catheter noted.

:
Negative for retained common duct stone.

## 2019-05-07 ENCOUNTER — Other Ambulatory Visit: Payer: Self-pay

## 2019-05-07 ENCOUNTER — Emergency Department
Admission: EM | Admit: 2019-05-07 | Discharge: 2019-05-07 | Disposition: A | Discharge status: Hospice | Payer: Medicare Other | Attending: Emergency Medicine | Admitting: Emergency Medicine

## 2019-05-07 ENCOUNTER — Encounter: Payer: Self-pay | Admitting: Emergency Medicine

## 2019-05-07 ENCOUNTER — Emergency Department: Payer: Medicare Other

## 2019-05-07 DIAGNOSIS — R1032 Left lower quadrant pain: Secondary | ICD-10-CM | POA: Diagnosis present

## 2019-05-07 DIAGNOSIS — F840 Autistic disorder: Secondary | ICD-10-CM | POA: Insufficient documentation

## 2019-05-07 DIAGNOSIS — N201 Calculus of ureter: Secondary | ICD-10-CM

## 2019-05-07 DIAGNOSIS — Z79899 Other long term (current) drug therapy: Secondary | ICD-10-CM | POA: Diagnosis not present

## 2019-05-07 DIAGNOSIS — N130 Hydronephrosis with ureteropelvic junction obstruction: Secondary | ICD-10-CM | POA: Insufficient documentation

## 2019-05-07 LAB — URINALYSIS, COMPLETE (UACMP) WITH MICROSCOPIC
Bacteria, UA: NONE SEEN
Bilirubin Urine: NEGATIVE
Glucose, UA: NEGATIVE mg/dL
Ketones, ur: NEGATIVE mg/dL
Leukocytes,Ua: NEGATIVE
Nitrite: NEGATIVE
Protein, ur: 100 mg/dL — AB
RBC / HPF: >50 RBC/hpf — ABNORMAL HIGH (ref 0–5)
Specific Gravity, Urine: 1.039 — ABNORMAL HIGH (ref 1.005–1.030)
Squamous Epithelial / LPF: NONE SEEN (ref 0–5)
pH: 5 (ref 5.0–8.0)

## 2019-05-07 LAB — COMPREHENSIVE METABOLIC PANEL
ALT: 42 U/L (ref 0–44)
AST: 18 U/L (ref 15–41)
Albumin: 4.7 g/dL (ref 3.5–5.0)
Alkaline Phosphatase: 86 U/L (ref 38–126)
Anion gap: 11 (ref 5–15)
BUN: 15 mg/dL (ref 6–20)
CO2: 26 mmol/L (ref 22–32)
Calcium: 9.4 mg/dL (ref 8.9–10.3)
Chloride: 100 mmol/L (ref 98–111)
Creatinine, Ser: 1.11 mg/dL (ref 0.61–1.24)
GFR calc Af Amer: >60 mL/min (ref >60)
GFR calc non Af Amer: >60 mL/min (ref >60)
Glucose, Bld: 121 mg/dL — ABNORMAL HIGH (ref 70–99)
Potassium: 3.2 mmol/L — ABNORMAL LOW (ref 3.5–5.1)
Sodium: 137 mmol/L (ref 135–145)
Total Bilirubin: 0.6 mg/dL (ref 0.3–1.2)
Total Protein: 7.7 g/dL (ref 6.5–8.1)

## 2019-05-07 LAB — CBC
HCT: 49 % (ref 39.0–52.0)
Hemoglobin: 16.9 g/dL (ref 13.0–17.0)
MCH: 29.8 pg (ref 26.0–34.0)
MCHC: 34.5 g/dL (ref 30.0–36.0)
MCV: 86.4 fL (ref 80.0–100.0)
Platelets: 321 10*3/uL (ref 150–400)
RBC: 5.67 MIL/uL (ref 4.22–5.81)
RDW: 12.4 % (ref 11.5–15.5)
WBC: 10.8 10*3/uL — ABNORMAL HIGH (ref 4.0–10.5)
nRBC: 0 % (ref 0.0–0.2)

## 2019-05-07 MED ORDER — HYDROCODONE-ACETAMINOPHEN 5-325 MG PO TABS: 1.0000 | ORAL_TABLET | ORAL | 0 refills | Status: AC | PRN

## 2019-05-07 MED ORDER — MORPHINE SULFATE (PF) 4 MG/ML IV SOLN
4.0000 mg | Freq: Once | INTRAVENOUS | Status: AC
Start: 2019-05-07 — End: 2019-05-07
  Administered 2019-05-07: 4 mg via INTRAVENOUS
  Filled 2019-05-07: qty 1

## 2019-05-07 MED ORDER — SODIUM CHLORIDE 0.9 % IV BOLUS
1000.0000 mL | Freq: Once | INTRAVENOUS | Status: AC
  Administered 2019-05-07: 1000 mL via INTRAVENOUS

## 2019-05-07 MED ORDER — ONDANSETRON HCL 4 MG/2ML IJ SOLN
4.0000 mg | Freq: Once | INTRAMUSCULAR | Status: AC
  Administered 2019-05-07: 14:00:00 4 mg via INTRAVENOUS
  Filled 2019-05-07: qty 2

## 2019-05-07 MED ORDER — TAMSULOSIN HCL 0.4 MG PO CAPS: 0.4000 mg | ORAL_CAPSULE | Freq: Every day | ORAL | 0 refills | Status: AC

## 2019-05-07 NOTE — ED Notes (Signed)
meds given IVF bolus infusing. Awaiting KUB r/o renal stone.

## 2019-05-07 NOTE — ED Triage Notes (Signed)
Left flank pain started today.  Also pain in testicle.

## 2019-05-07 NOTE — ED Notes (Signed)
Report pain that began this morning to left flank radiates into lower abd. Denies nausea. Rates pain 8/10. Awaiting md eval. Labs and urine sent. Results pending.

## 2019-05-07 NOTE — ED Provider Notes (Signed)
[facility] Emergency Department Provider Note  ___________________________________________   First MD Initiated Contact with Patient 05/07/19 1325     (approximate)  I have reviewed the triage vital signs and the nursing notes.   HISTORY  Chief Complaint Flank Pain and Testicle Pain  HPI Joshua [name] is a 23 y.o. male who presents to the emergency department for treatment and evaluation of left flank pain that radiates into the left lower abdomen.  No nausea.  Pain is 8 out of 10.  Pain started suddenly today.  No testicular pain.  No dysuria or urinary frequency.  No alleviating measures attempted prior to arrival.     Past Medical History:  Diagnosis Date  . Autism   . Autism   . Irritable bowel syndrome     Patient Active Problem List   Diagnosis Date Noted  . Pneumothorax 09/18/2016    Past Surgical History:  Procedure Laterality Date  . BILIARY DRAINAGE  2017  . CHOLECYSTECTOMY N/A 10/24/2016   Procedure: LAPAROSCOPIC CHOLECYSTECTOMY WITH INTRAOPERATIVE CHOLANGIOGRAM;  Surgeon: Ricarda Frameharles Woodham, MD;  Location: [facility] ORS;  Service: General;  Laterality: N/A;  . EYE SURGERY     leftt eye RAP  . IR GENERIC HISTORICAL  09/20/2016   IR PERC CHOLECYSTOSTOMY 09/20/2016 Simonne ComeJohn Watts, MD [facility]INTER[facility] RAD    Prior to Admission medications   Medication Sig Start Date End Date Taking? Authorizing Provider  ciprofloxacin (CIPRO) 500 MG tablet Take 1 tablet (500 mg total) by mouth 2 (two) times daily. Patient taking differently: Take 500 mg by mouth 2 (two) times daily. Started 09-17-16 has 2 weeks left to take. 09/26/16   Lattie Hawooper, Richard E, MD  famotidine (PEPCID) 10 MG tablet Take 10 mg by mouth daily.     [provider]  HYDROcodone-acetaminophen (NORCO/VICODIN) 5-325 MG tablet Take 1 tablet by mouth every 4 (four) hours as needed for moderate pain. 05/07/19 05/06/20  Trudie Cervantes, Rulon Eisenmengerari B, FNP  loratadine-pseudoephedrine (CLARITIN-D 12-HOUR) 5-120  MG tablet Take 1 tablet by mouth 2 (two) times daily as needed for allergies.    [provider]  Multiple Vitamins-Minerals (MULTIVITAMIN WITH MINERALS) tablet Take 1 tablet by mouth daily.    [provider]  Saccharomyces boulardii (PROBIOTIC) 250 MG CAPS Take 1 tablet by mouth.    [provider]  tamsulosin (FLOMAX) 0.4 MG CAPS capsule Take 1 capsule (0.4 mg total) by mouth daily. 05/07/19   Chinita Pesterriplett, Viggo Perko B, FNP    Allergies Patient has no known allergies.  Family History  Problem Relation Age of Onset  . Healthy Mother   . Healthy Father     Social History Social History   Tobacco Use  . Smoking status: Never Smoker  . Smokeless tobacco: Never Used  Substance Use Topics  . Alcohol use: No  . Drug use: No    Review of Systems  Constitutional: No fever/chills Eyes: No visual changes. ENT: No sore throat. Cardiovascular: Denies chest pain. Respiratory: Denies shortness of breath. Gastrointestinal: No abdominal pain.  No nausea, no vomiting.  No diarrhea.  No constipation. Genitourinary: Negative for dysuria. Musculoskeletal: Positive for left side back pain Skin: Negative for rash. Neurological: Negative for headaches, focal weakness or numbness. ____________________________________________   PHYSICAL EXAM:  VITAL SIGNS: ED Triage Vitals  Enc Vitals Group     BP 05/07/19 1230 139/76     Pulse Rate 05/07/19 1230 87     Resp 05/07/19 1230 18     Temp 05/07/19 1230 98.1  F (36.7 C)     Temp Source 05/07/19 1230 Oral     SpO2 05/07/19 1230 100 %     Weight 05/07/19 1226 180 lb (81.6 kg)     Height 05/07/19 1226  (1.778 m)     Head Circumference --      Peak Flow --      Pain Score 05/07/19 1224 8     Pain Loc --      Pain Edu? --      Excl. in GC? --     Constitutional: Alert and oriented.  Appears to be in pain. Eyes: Conjunctivae are normal. Head: Atraumatic. Nose: No congestion/rhinnorhea. Mouth/Throat: Mucous  membranes are moist.  Oropharynx non-erythematous. Neck: No stridor.   Cardiovascular: Normal rate, regular rhythm. Grossly normal heart sounds.  Good peripheral circulation. Respiratory: Normal respiratory effort.  No retractions. Lungs CTAB. Gastrointestinal: Soft and nontender. No distention. No abdominal bruits.  Left-sided CVA tenderness.   Musculoskeletal: No lower extremity tenderness nor edema.  No joint effusions. Neurologic:  Normal speech and language. No gross focal neurologic deficits are appreciated. No gait instability. Skin:  Skin is warm, dry and intact. No rash noted. Psychiatric: Mood and affect are normal. Speech and behavior are normal.  ____________________________________________   LABS (all labs ordered are listed, but only abnormal results are displayed)  Labs Reviewed  COMPREHENSIVE METABOLIC PANEL - Abnormal; Notable for the following components:      Result Value   Potassium 3.2 (*)    Glucose, Bld 121 (*)    All other components within normal limits  CBC - Abnormal; Notable for the following components:   WBC 10.8 (*)    All other components within normal limits  URINALYSIS, COMPLETE (UACMP) WITH MICROSCOPIC - Abnormal; Notable for the following components:   Color, Urine AMBER (*)    APPearance HAZY (*)    Specific Gravity, Urine 1.039 (*)    Hgb urine dipstick LARGE (*)    Protein, ur 100 (*)    RBC / HPF >50 (*)    All other components within normal limits   ____________________________________________  EKG  Not indicated ____________________________________________  RADIOLOGY  ED MD interpretation: 2 mm calculus at the left ureterovesicular junction with some mild associated left hydronephrosis and hydroureter.  Official radiology report(s): Ct Renal Stone Study  Result Date: 05/07/2019 CLINICAL DATA:  Left flank pain, hematuria EXAM: CT ABDOMEN AND PELVIS WITHOUT CONTRAST TECHNIQUE: Multidetector CT imaging of the abdomen and pelvis was  performed following the standard protocol without IV contrast. COMPARISON:  09/23/2016 FINDINGS: Lower chest: No acute abnormality. Hepatobiliary: No focal liver abnormality is seen. Status post cholecystectomy. No biliary dilatation. Pancreas: Unremarkable. No pancreatic ductal dilatation or surrounding inflammatory changes. Spleen: Normal in size without focal abnormality. Adrenals/Urinary Tract: Adrenal glands are unremarkable. There is a 2 mm calculus at the left ureterovesicular junction with mild associated left hydronephrosis and hydroureter (series 2, image 75). Bladder is unremarkable. Stomach/Bowel: Stomach is within normal limits. Appendix appears normal. No evidence of bowel wall thickening, distention, or inflammatory changes. Vascular/Lymphatic: No significant vascular findings are present. No enlarged abdominal or pelvic lymph nodes. Reproductive: No mass or other abnormality. Other: No abdominal wall hernia or abnormality. No abdominopelvic ascites. Musculoskeletal: No acute or significant osseous findings. IMPRESSION: There is a 2 mm calculus at the left ureterovesicular junction with mild associated left hydronephrosis and hydroureter (series 2, image 75). No other evidence of urinary tract calculus. Electronically Signed   By: Trinna Post  Jayme Cloud M.D.   On: 05/07/2019 14:26    ____________________________________________   PROCEDURES  Procedure(s) performed: None  Procedures  Critical Care performed: No  ____________________________________________   INITIAL IMPRESSION / ASSESSMENT AND PLAN / ED COURSE     23 year old male presents to the emergency department for treatment and evaluation of left flank and left lower abdominal pain that started acutely this morning.  While here, 1 L of IV fluids, Zofran, and morphine was given with significant decrease in pain.  CT for renal stone does show a 2 mm ureterovesicular stone with associated mild hydronephrosis.  He will be treated with  Flomax and Norco.  He is to see his primary care provider as scheduled this week.  He is also to follow-up with urology if not improving over a week or so.  He is to return to the emergency department for symptoms change or worsen if unable to schedule appointment.      ____________________________________________   FINAL CLINICAL IMPRESSION(S) / ED DIAGNOSES  Final diagnoses:  Ureterolithiasis     ED Discharge Orders         Ordered    HYDROcodone-acetaminophen (NORCO/VICODIN) 5-325 MG tablet  Every 4 hours PRN     05/07/19 1510    tamsulosin (FLOMAX) 0.4 MG CAPS capsule  Daily     05/07/19 1510           Note:  This document was prepared using Dragon voice recognition software and may include unintentional dictation errors.    Chinita Pester, FNP 05/07/19 1517    Sharman Cheek, MD 05/10/19 [phone]

## 2019-05-07 NOTE — Discharge Instructions (Signed)
Please keep her scheduled appointment with Dr. Lacie Scotts.  Follow-up with Dr. Apolinar Junes in about a week if you are still having pain in your back or around on your left side.  Return to the emergency department for symptoms of change or worsen if you are unable to schedule an appointment.

## 2021-07-11 ENCOUNTER — Ambulatory Visit
Admission: RE | Admit: 2021-07-11 | Discharge: 2021-07-11 | Disposition: A | Discharge status: Home / Self Care | Payer: Medicare Other | Source: Ambulatory Visit | Attending: Family Medicine | Admitting: Family Medicine

## 2021-07-11 ENCOUNTER — Other Ambulatory Visit: Payer: Self-pay | Admitting: Family Medicine

## 2021-07-11 ENCOUNTER — Other Ambulatory Visit: Payer: Self-pay

## 2021-07-11 DIAGNOSIS — N23 Unspecified renal colic: Secondary | ICD-10-CM

## 2023-09-28 DIAGNOSIS — D485 Neoplasm of uncertain behavior of skin: Secondary | ICD-10-CM | POA: Diagnosis not present

## 2023-09-28 DIAGNOSIS — D224 Melanocytic nevi of scalp and neck: Secondary | ICD-10-CM | POA: Diagnosis not present

## 2023-09-28 DIAGNOSIS — L2489 Irritant contact dermatitis due to other agents: Secondary | ICD-10-CM | POA: Diagnosis not present

## 2023-10-31 DIAGNOSIS — E559 Vitamin D deficiency, unspecified: Secondary | ICD-10-CM | POA: Diagnosis not present

## 2023-10-31 DIAGNOSIS — Z13228 Encounter for screening for other metabolic disorders: Secondary | ICD-10-CM | POA: Diagnosis not present

## 2023-10-31 DIAGNOSIS — E782 Mixed hyperlipidemia: Secondary | ICD-10-CM | POA: Diagnosis not present

## 2023-10-31 DIAGNOSIS — Z0000 Encounter for general adult medical examination without abnormal findings: Secondary | ICD-10-CM | POA: Diagnosis not present

## 2023-10-31 DIAGNOSIS — Z131 Encounter for screening for diabetes mellitus: Secondary | ICD-10-CM | POA: Diagnosis not present

## 2023-10-31 DIAGNOSIS — Z1331 Encounter for screening for depression: Secondary | ICD-10-CM | POA: Diagnosis not present

## 2023-10-31 DIAGNOSIS — Z130 Encounter for screening for diseases of the blood and blood-forming organs and certain disorders involving the immune mechanism: Secondary | ICD-10-CM | POA: Diagnosis not present
